# Patient Record
Sex: Female | Born: 1960
Health system: Southern US, Community
[De-identification: ages and names within clinical notes are randomized; demographics above are authoritative.]

## PROBLEM LIST (undated history)

## (undated) DIAGNOSIS — F419 Anxiety disorder, unspecified: Secondary | ICD-10-CM

## (undated) DIAGNOSIS — R160 Hepatomegaly, not elsewhere classified: Secondary | ICD-10-CM

## (undated) DIAGNOSIS — F319 Bipolar disorder, unspecified: Secondary | ICD-10-CM

## (undated) HISTORY — DX: Bipolar disorder, unspecified: F31.9

## (undated) HISTORY — PX: TONSILLECTOMY AND ADENOIDECTOMY: SUR1326

## (undated) HISTORY — DX: Hepatomegaly, not elsewhere classified: R16.0

## (undated) HISTORY — DX: Anxiety disorder, unspecified: F41.9

## (undated) HISTORY — PX: BREAST BIOPSY: SHX20

---

## 2014-02-25 DIAGNOSIS — F259 Schizoaffective disorder, unspecified: Secondary | ICD-10-CM | POA: Diagnosis not present

## 2014-02-25 DIAGNOSIS — F319 Bipolar disorder, unspecified: Secondary | ICD-10-CM | POA: Diagnosis not present

## 2014-04-15 DIAGNOSIS — F319 Bipolar disorder, unspecified: Secondary | ICD-10-CM | POA: Diagnosis not present

## 2014-04-15 DIAGNOSIS — F259 Schizoaffective disorder, unspecified: Secondary | ICD-10-CM | POA: Diagnosis not present

## 2014-05-06 DIAGNOSIS — F319 Bipolar disorder, unspecified: Secondary | ICD-10-CM | POA: Diagnosis not present

## 2014-05-06 DIAGNOSIS — F259 Schizoaffective disorder, unspecified: Secondary | ICD-10-CM | POA: Diagnosis not present

## 2014-05-27 DIAGNOSIS — F250 Schizoaffective disorder, bipolar type: Secondary | ICD-10-CM | POA: Diagnosis not present

## 2014-06-04 DIAGNOSIS — H524 Presbyopia: Secondary | ICD-10-CM | POA: Diagnosis not present

## 2014-06-04 DIAGNOSIS — H52203 Unspecified astigmatism, bilateral: Secondary | ICD-10-CM | POA: Diagnosis not present

## 2014-06-04 DIAGNOSIS — H5213 Myopia, bilateral: Secondary | ICD-10-CM | POA: Diagnosis not present

## 2014-06-12 DIAGNOSIS — F250 Schizoaffective disorder, bipolar type: Secondary | ICD-10-CM | POA: Diagnosis not present

## 2014-06-25 DIAGNOSIS — F250 Schizoaffective disorder, bipolar type: Secondary | ICD-10-CM | POA: Diagnosis not present

## 2014-07-09 DIAGNOSIS — F250 Schizoaffective disorder, bipolar type: Secondary | ICD-10-CM | POA: Diagnosis not present

## 2014-07-23 DIAGNOSIS — F250 Schizoaffective disorder, bipolar type: Secondary | ICD-10-CM | POA: Diagnosis not present

## 2014-07-25 DIAGNOSIS — F250 Schizoaffective disorder, bipolar type: Secondary | ICD-10-CM | POA: Diagnosis not present

## 2014-08-06 DIAGNOSIS — F250 Schizoaffective disorder, bipolar type: Secondary | ICD-10-CM | POA: Diagnosis not present

## 2014-08-20 DIAGNOSIS — F250 Schizoaffective disorder, bipolar type: Secondary | ICD-10-CM | POA: Diagnosis not present

## 2014-09-03 DIAGNOSIS — F250 Schizoaffective disorder, bipolar type: Secondary | ICD-10-CM | POA: Diagnosis not present

## 2014-09-17 DIAGNOSIS — F250 Schizoaffective disorder, bipolar type: Secondary | ICD-10-CM | POA: Diagnosis not present

## 2014-10-01 DIAGNOSIS — F250 Schizoaffective disorder, bipolar type: Secondary | ICD-10-CM | POA: Diagnosis not present

## 2014-10-17 DIAGNOSIS — F250 Schizoaffective disorder, bipolar type: Secondary | ICD-10-CM | POA: Diagnosis not present

## 2014-10-29 DIAGNOSIS — F250 Schizoaffective disorder, bipolar type: Secondary | ICD-10-CM | POA: Diagnosis not present

## 2014-11-12 DIAGNOSIS — F250 Schizoaffective disorder, bipolar type: Secondary | ICD-10-CM | POA: Diagnosis not present

## 2014-11-26 DIAGNOSIS — F250 Schizoaffective disorder, bipolar type: Secondary | ICD-10-CM | POA: Diagnosis not present

## 2014-12-03 DIAGNOSIS — Z0000 Encounter for general adult medical examination without abnormal findings: Secondary | ICD-10-CM | POA: Diagnosis not present

## 2014-12-03 DIAGNOSIS — R739 Hyperglycemia, unspecified: Secondary | ICD-10-CM | POA: Diagnosis not present

## 2014-12-03 DIAGNOSIS — E785 Hyperlipidemia, unspecified: Secondary | ICD-10-CM | POA: Diagnosis not present

## 2014-12-03 DIAGNOSIS — Z1211 Encounter for screening for malignant neoplasm of colon: Secondary | ICD-10-CM | POA: Diagnosis not present

## 2014-12-24 DIAGNOSIS — F250 Schizoaffective disorder, bipolar type: Secondary | ICD-10-CM | POA: Diagnosis not present

## 2014-12-24 DIAGNOSIS — Z1211 Encounter for screening for malignant neoplasm of colon: Secondary | ICD-10-CM | POA: Diagnosis not present

## 2014-12-29 DIAGNOSIS — R0602 Shortness of breath: Secondary | ICD-10-CM | POA: Diagnosis not present

## 2014-12-29 DIAGNOSIS — J209 Acute bronchitis, unspecified: Secondary | ICD-10-CM | POA: Diagnosis not present

## 2015-01-05 DIAGNOSIS — J342 Deviated nasal septum: Secondary | ICD-10-CM | POA: Diagnosis not present

## 2015-01-05 DIAGNOSIS — J3089 Other allergic rhinitis: Secondary | ICD-10-CM | POA: Diagnosis not present

## 2015-01-05 DIAGNOSIS — J209 Acute bronchitis, unspecified: Secondary | ICD-10-CM | POA: Diagnosis not present

## 2015-01-05 DIAGNOSIS — R0982 Postnasal drip: Secondary | ICD-10-CM | POA: Diagnosis not present

## 2015-01-13 DIAGNOSIS — F319 Bipolar disorder, unspecified: Secondary | ICD-10-CM | POA: Diagnosis not present

## 2015-01-13 DIAGNOSIS — F419 Anxiety disorder, unspecified: Secondary | ICD-10-CM | POA: Diagnosis not present

## 2015-01-13 DIAGNOSIS — F250 Schizoaffective disorder, bipolar type: Secondary | ICD-10-CM | POA: Diagnosis not present

## 2015-01-13 DIAGNOSIS — J3089 Other allergic rhinitis: Secondary | ICD-10-CM | POA: Diagnosis not present

## 2015-01-13 DIAGNOSIS — J342 Deviated nasal septum: Secondary | ICD-10-CM | POA: Diagnosis not present

## 2015-01-21 DIAGNOSIS — F250 Schizoaffective disorder, bipolar type: Secondary | ICD-10-CM | POA: Diagnosis not present

## 2015-01-27 DIAGNOSIS — F250 Schizoaffective disorder, bipolar type: Secondary | ICD-10-CM | POA: Diagnosis not present

## 2015-02-10 DIAGNOSIS — F250 Schizoaffective disorder, bipolar type: Secondary | ICD-10-CM | POA: Diagnosis not present

## 2015-02-24 DIAGNOSIS — F250 Schizoaffective disorder, bipolar type: Secondary | ICD-10-CM | POA: Diagnosis not present

## 2015-03-10 DIAGNOSIS — F250 Schizoaffective disorder, bipolar type: Secondary | ICD-10-CM | POA: Diagnosis not present

## 2015-03-24 DIAGNOSIS — F250 Schizoaffective disorder, bipolar type: Secondary | ICD-10-CM | POA: Diagnosis not present

## 2015-04-20 DIAGNOSIS — F411 Generalized anxiety disorder: Secondary | ICD-10-CM | POA: Diagnosis not present

## 2015-04-20 DIAGNOSIS — M791 Myalgia: Secondary | ICD-10-CM | POA: Diagnosis not present

## 2015-04-22 DIAGNOSIS — J069 Acute upper respiratory infection, unspecified: Secondary | ICD-10-CM | POA: Diagnosis not present

## 2015-04-27 DIAGNOSIS — R0602 Shortness of breath: Secondary | ICD-10-CM | POA: Diagnosis not present

## 2015-04-27 DIAGNOSIS — J018 Other acute sinusitis: Secondary | ICD-10-CM | POA: Diagnosis not present

## 2015-05-19 DIAGNOSIS — F250 Schizoaffective disorder, bipolar type: Secondary | ICD-10-CM | POA: Diagnosis not present

## 2015-06-02 DIAGNOSIS — F250 Schizoaffective disorder, bipolar type: Secondary | ICD-10-CM | POA: Diagnosis not present

## 2015-06-23 DIAGNOSIS — F250 Schizoaffective disorder, bipolar type: Secondary | ICD-10-CM | POA: Diagnosis not present

## 2015-07-07 DIAGNOSIS — F250 Schizoaffective disorder, bipolar type: Secondary | ICD-10-CM | POA: Diagnosis not present

## 2015-07-28 DIAGNOSIS — F250 Schizoaffective disorder, bipolar type: Secondary | ICD-10-CM | POA: Diagnosis not present

## 2015-09-08 DIAGNOSIS — F250 Schizoaffective disorder, bipolar type: Secondary | ICD-10-CM | POA: Diagnosis not present

## 2015-09-22 DIAGNOSIS — F250 Schizoaffective disorder, bipolar type: Secondary | ICD-10-CM | POA: Diagnosis not present

## 2015-09-30 ENCOUNTER — Other Ambulatory Visit: Payer: Self-pay | Admitting: Family Medicine

## 2015-09-30 DIAGNOSIS — Z1231 Encounter for screening mammogram for malignant neoplasm of breast: Secondary | ICD-10-CM

## 2015-10-10 DIAGNOSIS — F250 Schizoaffective disorder, bipolar type: Secondary | ICD-10-CM | POA: Diagnosis not present

## 2015-10-20 DIAGNOSIS — F250 Schizoaffective disorder, bipolar type: Secondary | ICD-10-CM | POA: Diagnosis not present

## 2015-10-23 DIAGNOSIS — F250 Schizoaffective disorder, bipolar type: Secondary | ICD-10-CM | POA: Diagnosis not present

## 2015-11-03 DIAGNOSIS — F250 Schizoaffective disorder, bipolar type: Secondary | ICD-10-CM | POA: Diagnosis not present

## 2015-11-10 ENCOUNTER — Ambulatory Visit: Payer: Self-pay

## 2015-11-10 ENCOUNTER — Ambulatory Visit
Admission: RE | Admit: 2015-11-10 | Discharge: 2015-11-10 | Disposition: A | Discharge status: Home / Self Care | Payer: Medicare Other | Source: Ambulatory Visit | Attending: Family Medicine | Admitting: Family Medicine

## 2015-11-10 DIAGNOSIS — Z1231 Encounter for screening mammogram for malignant neoplasm of breast: Secondary | ICD-10-CM

## 2015-11-17 DIAGNOSIS — F250 Schizoaffective disorder, bipolar type: Secondary | ICD-10-CM | POA: Diagnosis not present

## 2015-12-01 DIAGNOSIS — F250 Schizoaffective disorder, bipolar type: Secondary | ICD-10-CM | POA: Diagnosis not present

## 2015-12-08 DIAGNOSIS — E785 Hyperlipidemia, unspecified: Secondary | ICD-10-CM | POA: Diagnosis not present

## 2015-12-08 DIAGNOSIS — Z1211 Encounter for screening for malignant neoplasm of colon: Secondary | ICD-10-CM | POA: Diagnosis not present

## 2015-12-08 DIAGNOSIS — Z0000 Encounter for general adult medical examination without abnormal findings: Secondary | ICD-10-CM | POA: Diagnosis not present

## 2015-12-15 DIAGNOSIS — F250 Schizoaffective disorder, bipolar type: Secondary | ICD-10-CM | POA: Diagnosis not present

## 2015-12-29 DIAGNOSIS — F250 Schizoaffective disorder, bipolar type: Secondary | ICD-10-CM | POA: Diagnosis not present

## 2016-01-21 DIAGNOSIS — F250 Schizoaffective disorder, bipolar type: Secondary | ICD-10-CM | POA: Diagnosis not present

## 2016-02-04 DIAGNOSIS — F250 Schizoaffective disorder, bipolar type: Secondary | ICD-10-CM | POA: Diagnosis not present

## 2016-03-03 DIAGNOSIS — F250 Schizoaffective disorder, bipolar type: Secondary | ICD-10-CM | POA: Diagnosis not present

## 2016-03-17 DIAGNOSIS — F429 Obsessive-compulsive disorder, unspecified: Secondary | ICD-10-CM | POA: Diagnosis not present

## 2016-03-17 DIAGNOSIS — F250 Schizoaffective disorder, bipolar type: Secondary | ICD-10-CM | POA: Diagnosis not present

## 2016-04-01 DIAGNOSIS — F250 Schizoaffective disorder, bipolar type: Secondary | ICD-10-CM | POA: Diagnosis not present

## 2016-04-01 DIAGNOSIS — F429 Obsessive-compulsive disorder, unspecified: Secondary | ICD-10-CM | POA: Diagnosis not present

## 2016-04-07 DIAGNOSIS — Z1211 Encounter for screening for malignant neoplasm of colon: Secondary | ICD-10-CM | POA: Diagnosis not present

## 2016-04-16 DIAGNOSIS — F429 Obsessive-compulsive disorder, unspecified: Secondary | ICD-10-CM | POA: Diagnosis not present

## 2016-04-16 DIAGNOSIS — F250 Schizoaffective disorder, bipolar type: Secondary | ICD-10-CM | POA: Diagnosis not present

## 2016-04-26 DIAGNOSIS — F250 Schizoaffective disorder, bipolar type: Secondary | ICD-10-CM | POA: Diagnosis not present

## 2016-04-26 DIAGNOSIS — F429 Obsessive-compulsive disorder, unspecified: Secondary | ICD-10-CM | POA: Diagnosis not present

## 2016-05-10 DIAGNOSIS — F250 Schizoaffective disorder, bipolar type: Secondary | ICD-10-CM | POA: Diagnosis not present

## 2016-05-20 DIAGNOSIS — F429 Obsessive-compulsive disorder, unspecified: Secondary | ICD-10-CM | POA: Diagnosis not present

## 2016-05-20 DIAGNOSIS — F250 Schizoaffective disorder, bipolar type: Secondary | ICD-10-CM | POA: Diagnosis not present

## 2016-05-28 DIAGNOSIS — F250 Schizoaffective disorder, bipolar type: Secondary | ICD-10-CM | POA: Diagnosis not present

## 2016-05-28 DIAGNOSIS — F429 Obsessive-compulsive disorder, unspecified: Secondary | ICD-10-CM | POA: Diagnosis not present

## 2016-06-07 DIAGNOSIS — F250 Schizoaffective disorder, bipolar type: Secondary | ICD-10-CM | POA: Diagnosis not present

## 2016-06-07 DIAGNOSIS — F429 Obsessive-compulsive disorder, unspecified: Secondary | ICD-10-CM | POA: Diagnosis not present

## 2016-06-21 DIAGNOSIS — F250 Schizoaffective disorder, bipolar type: Secondary | ICD-10-CM | POA: Diagnosis not present

## 2016-06-21 DIAGNOSIS — F429 Obsessive-compulsive disorder, unspecified: Secondary | ICD-10-CM | POA: Diagnosis not present

## 2016-06-30 ENCOUNTER — Encounter (HOSPITAL_COMMUNITY): Payer: Self-pay | Admitting: *Deleted

## 2016-06-30 DIAGNOSIS — L509 Urticaria, unspecified: Secondary | ICD-10-CM | POA: Insufficient documentation

## 2016-06-30 DIAGNOSIS — T7840XA Allergy, unspecified, initial encounter: Secondary | ICD-10-CM | POA: Insufficient documentation

## 2016-06-30 NOTE — ED Triage Notes (Signed)
The pt started itching on her upper torso 1800  Some hives in her a-c jopint behind her knees and she has more along her bra line and around her waist.  Itching.  She came in by [facility]  They gave her benadry 25mg   She has no difficulty breathing at present

## 2016-07-01 ENCOUNTER — Emergency Department (HOSPITAL_COMMUNITY)
Admission: EM | Admit: 2016-07-01 | Discharge: 2016-07-01 | Disposition: A | Discharge status: Home / Self Care | Payer: Medicare Other | Attending: Emergency Medicine | Admitting: Emergency Medicine

## 2016-07-01 DIAGNOSIS — T7840XA Allergy, unspecified, initial encounter: Secondary | ICD-10-CM

## 2016-07-01 DIAGNOSIS — L509 Urticaria, unspecified: Secondary | ICD-10-CM

## 2016-07-01 MED ORDER — DIPHENHYDRAMINE HCL 25 MG PO CAPS
25.0000 mg | ORAL_CAPSULE | Freq: Once | ORAL | Status: AC
  Administered 2016-07-01: 25 mg via ORAL
  Filled 2016-07-01: qty 1

## 2016-07-01 MED ORDER — RANITIDINE HCL 150 MG/10ML PO SYRP
150.0000 mg | ORAL_SOLUTION | Freq: Once | ORAL | Status: AC
  Administered 2016-07-01: 150 mg via ORAL
  Filled 2016-07-01: qty 10

## 2016-07-01 MED ORDER — DEXAMETHASONE SODIUM PHOSPHATE 10 MG/ML IJ SOLN
10.0000 mg | Freq: Once | INTRAMUSCULAR | Status: AC
  Administered 2016-07-01: 10 mg via INTRAVENOUS
  Filled 2016-07-01: qty 1

## 2016-07-01 NOTE — ED Provider Notes (Signed)
[facility] DEPT Provider Note   CSN: [id number] Arrival date & time: 06/30/16  2339  By signing my name below, I, Oleh Genin, attest that this documentation has been prepared under the direction and in the presence of Varney Biles, MD. Electronically Signed: Oleh Genin, Scribe. 07/01/16. 12:45 AM.   History   Chief Complaint Chief Complaint  Patient presents with  . Allergic Reaction    HPI Misty Campos is a 56 y.o. female with history of bipolarism who presents to the ED with a suspected allergic reaction. This patient states that approximately 7 hours ago she first noticed a rash in her bilateral axilla with subsequent presentation over the upper abdomen and medial thighs. She is having associated itching. She called EMS who gave 25mg  Benadryl; she states that the medication helped her itching. She denies any known allergies. She has no new exposures today of which she is aware. Denies any vomiting, diarrhea, or dyspnea.  The history is provided by the patient. No language interpreter was used.  Allergic Reaction  Presenting symptoms: no difficulty breathing and no swelling   Severity:  Moderate Prior allergic episodes:  No prior episodes Context comment:  Unknown Relieved by: benadryl.   History reviewed. No pertinent past medical history.  There are no active problems to display for this patient.   History reviewed. No pertinent surgical history.  OB History    No data available       Home Medications    Prior to Admission medications   Not on File    Family History No family history on file.  Social History Social History  Substance Use Topics  . Smoking status: Never Smoker  . Smokeless tobacco: Never Used  . Alcohol use No     Allergies   Patient has no known allergies.   Review of Systems Review of Systems All systems reviewed and are negative for acute change except as noted in the HPI.  Physical Exam Updated Vital  Signs BP 119/66   Pulse 80   Temp 97.7 F (36.5 C) (Oral)   Resp 18   SpO2 100%   Physical Exam  Constitutional: She is oriented to person, place, and time. She appears well-developed and well-nourished.  HENT:  Head: Normocephalic and atraumatic.  No edema over the oral mucosa.   Cardiovascular: Normal rate.   Pulmonary/Chest: Effort normal and breath sounds normal. No stridor. No respiratory distress. She has no wheezes. She has no rales.  Neurological: She is alert and oriented to person, place, and time.  Skin: Skin is warm and dry.  There is an urticarial type lesion over the distal wrists, over the bilateral upper quadrants, and over the inguinal area. The erythematous regions are blanching.  Psychiatric: She has a normal mood and affect.  Nursing note and vitals reviewed.    ED Treatments / Results  Labs (all labs ordered are listed, but only abnormal results are displayed) Labs Reviewed - No data to display  EKG  EKG Interpretation None       Radiology No results found.  Procedures Procedures (including critical care time)  Medications Ordered in ED Medications  diphenhydrAMINE (BENADRYL) capsule 25 mg (25 mg Oral Given 07/01/16 0101)  ranitidine (ZANTAC) 150 MG/10ML syrup 150 mg (150 mg Oral Given 07/01/16 0101)  dexamethasone (DECADRON) injection 10 mg (10 mg Intravenous Given 07/01/16 0101)     Initial Impression / Assessment and Plan / ED Course  I have reviewed the triage vital signs and the nursing  notes.  Pertinent labs & imaging results that were available during my care of the patient were reviewed by me and considered in my medical decision making (see chart for details).    Pt comes in with urticarial rash, unknown source. Pt has no respiratory complain or stridor, drooling, wheezing. WE will give her the allergy meds and reassess.  REASSESSMENT: Rash improved over the wrist and abd. Strict ER return precautions have been discussed, and  patient is agreeing with the plan and is comfortable with the workup done and the recommendations from the ER.     Final Clinical Impressions(s) / ED Diagnoses   Final diagnoses:  Urticaria  Allergic reaction, initial encounter    New Prescriptions There are no discharge medications for this patient.    Varney Biles, MD 07/05/16 [phone]

## 2016-07-01 NOTE — ED Notes (Signed)
D/c instructions given. Pt verbalizes understanding of instructions. Pt ambulatory and discharged by self.

## 2016-07-02 DIAGNOSIS — F3181 Bipolar II disorder: Secondary | ICD-10-CM | POA: Diagnosis not present

## 2016-07-02 DIAGNOSIS — K5900 Constipation, unspecified: Secondary | ICD-10-CM | POA: Diagnosis not present

## 2016-07-02 DIAGNOSIS — L309 Dermatitis, unspecified: Secondary | ICD-10-CM | POA: Diagnosis not present

## 2016-07-05 DIAGNOSIS — F429 Obsessive-compulsive disorder, unspecified: Secondary | ICD-10-CM | POA: Diagnosis not present

## 2016-07-05 DIAGNOSIS — F250 Schizoaffective disorder, bipolar type: Secondary | ICD-10-CM | POA: Diagnosis not present

## 2016-07-20 DIAGNOSIS — F429 Obsessive-compulsive disorder, unspecified: Secondary | ICD-10-CM | POA: Diagnosis not present

## 2016-07-20 DIAGNOSIS — F250 Schizoaffective disorder, bipolar type: Secondary | ICD-10-CM | POA: Diagnosis not present

## 2016-08-19 DIAGNOSIS — F250 Schizoaffective disorder, bipolar type: Secondary | ICD-10-CM | POA: Diagnosis not present

## 2016-08-19 DIAGNOSIS — F429 Obsessive-compulsive disorder, unspecified: Secondary | ICD-10-CM | POA: Diagnosis not present

## 2016-08-31 DIAGNOSIS — L814 Other melanin hyperpigmentation: Secondary | ICD-10-CM | POA: Diagnosis not present

## 2016-08-31 DIAGNOSIS — D225 Melanocytic nevi of trunk: Secondary | ICD-10-CM | POA: Diagnosis not present

## 2016-08-31 DIAGNOSIS — L821 Other seborrheic keratosis: Secondary | ICD-10-CM | POA: Diagnosis not present

## 2016-09-02 DIAGNOSIS — F429 Obsessive-compulsive disorder, unspecified: Secondary | ICD-10-CM | POA: Diagnosis not present

## 2016-09-02 DIAGNOSIS — F250 Schizoaffective disorder, bipolar type: Secondary | ICD-10-CM | POA: Diagnosis not present

## 2016-09-15 DIAGNOSIS — F429 Obsessive-compulsive disorder, unspecified: Secondary | ICD-10-CM | POA: Diagnosis not present

## 2016-09-15 DIAGNOSIS — F250 Schizoaffective disorder, bipolar type: Secondary | ICD-10-CM | POA: Diagnosis not present

## 2016-09-29 DIAGNOSIS — Z124 Encounter for screening for malignant neoplasm of cervix: Secondary | ICD-10-CM | POA: Diagnosis not present

## 2016-09-29 DIAGNOSIS — Z01419 Encounter for gynecological examination (general) (routine) without abnormal findings: Secondary | ICD-10-CM | POA: Diagnosis not present

## 2016-10-06 DIAGNOSIS — F429 Obsessive-compulsive disorder, unspecified: Secondary | ICD-10-CM | POA: Diagnosis not present

## 2016-10-06 DIAGNOSIS — F250 Schizoaffective disorder, bipolar type: Secondary | ICD-10-CM | POA: Diagnosis not present

## 2016-10-08 DIAGNOSIS — R140 Abdominal distension (gaseous): Secondary | ICD-10-CM | POA: Diagnosis not present

## 2016-10-08 DIAGNOSIS — N950 Postmenopausal bleeding: Secondary | ICD-10-CM | POA: Diagnosis not present

## 2016-10-21 DIAGNOSIS — F250 Schizoaffective disorder, bipolar type: Secondary | ICD-10-CM | POA: Diagnosis not present

## 2016-10-21 DIAGNOSIS — F429 Obsessive-compulsive disorder, unspecified: Secondary | ICD-10-CM | POA: Diagnosis not present

## 2016-11-10 DIAGNOSIS — K5900 Constipation, unspecified: Secondary | ICD-10-CM | POA: Diagnosis not present

## 2016-11-15 DIAGNOSIS — Z1231 Encounter for screening mammogram for malignant neoplasm of breast: Secondary | ICD-10-CM | POA: Diagnosis not present

## 2016-11-16 DIAGNOSIS — F250 Schizoaffective disorder, bipolar type: Secondary | ICD-10-CM | POA: Diagnosis not present

## 2016-11-22 DIAGNOSIS — F250 Schizoaffective disorder, bipolar type: Secondary | ICD-10-CM | POA: Diagnosis not present

## 2016-11-22 DIAGNOSIS — F429 Obsessive-compulsive disorder, unspecified: Secondary | ICD-10-CM | POA: Diagnosis not present

## 2016-11-25 DIAGNOSIS — H43811 Vitreous degeneration, right eye: Secondary | ICD-10-CM | POA: Diagnosis not present

## 2016-12-08 DIAGNOSIS — F429 Obsessive-compulsive disorder, unspecified: Secondary | ICD-10-CM | POA: Diagnosis not present

## 2016-12-08 DIAGNOSIS — F250 Schizoaffective disorder, bipolar type: Secondary | ICD-10-CM | POA: Diagnosis not present

## 2016-12-14 DIAGNOSIS — F3181 Bipolar II disorder: Secondary | ICD-10-CM | POA: Diagnosis not present

## 2016-12-14 DIAGNOSIS — E785 Hyperlipidemia, unspecified: Secondary | ICD-10-CM | POA: Diagnosis not present

## 2016-12-14 DIAGNOSIS — Z0000 Encounter for general adult medical examination without abnormal findings: Secondary | ICD-10-CM | POA: Diagnosis not present

## 2016-12-14 DIAGNOSIS — Z5181 Encounter for therapeutic drug level monitoring: Secondary | ICD-10-CM | POA: Diagnosis not present

## 2016-12-14 DIAGNOSIS — K5900 Constipation, unspecified: Secondary | ICD-10-CM | POA: Diagnosis not present

## 2016-12-23 DIAGNOSIS — F429 Obsessive-compulsive disorder, unspecified: Secondary | ICD-10-CM | POA: Diagnosis not present

## 2016-12-23 DIAGNOSIS — F250 Schizoaffective disorder, bipolar type: Secondary | ICD-10-CM | POA: Diagnosis not present

## 2017-01-17 DIAGNOSIS — F429 Obsessive-compulsive disorder, unspecified: Secondary | ICD-10-CM | POA: Diagnosis not present

## 2017-01-17 DIAGNOSIS — F250 Schizoaffective disorder, bipolar type: Secondary | ICD-10-CM | POA: Diagnosis not present

## 2017-02-07 DIAGNOSIS — F429 Obsessive-compulsive disorder, unspecified: Secondary | ICD-10-CM | POA: Diagnosis not present

## 2017-03-01 DIAGNOSIS — F250 Schizoaffective disorder, bipolar type: Secondary | ICD-10-CM | POA: Diagnosis not present

## 2017-03-01 DIAGNOSIS — F429 Obsessive-compulsive disorder, unspecified: Secondary | ICD-10-CM | POA: Diagnosis not present

## 2017-03-03 DIAGNOSIS — R21 Rash and other nonspecific skin eruption: Secondary | ICD-10-CM | POA: Diagnosis not present

## 2017-03-03 DIAGNOSIS — B001 Herpesviral vesicular dermatitis: Secondary | ICD-10-CM | POA: Diagnosis not present

## 2017-03-03 DIAGNOSIS — R51 Headache: Secondary | ICD-10-CM | POA: Diagnosis not present

## 2017-03-03 DIAGNOSIS — B0059 Other herpesviral disease of eye: Secondary | ICD-10-CM | POA: Diagnosis not present

## 2017-03-03 DIAGNOSIS — H5789 Other specified disorders of eye and adnexa: Secondary | ICD-10-CM | POA: Diagnosis not present

## 2017-03-04 DIAGNOSIS — B0239 Other herpes zoster eye disease: Secondary | ICD-10-CM | POA: Diagnosis not present

## 2017-03-08 DIAGNOSIS — B0239 Other herpes zoster eye disease: Secondary | ICD-10-CM | POA: Diagnosis not present

## 2017-03-11 DIAGNOSIS — B029 Zoster without complications: Secondary | ICD-10-CM | POA: Diagnosis not present

## 2017-03-14 DIAGNOSIS — B0239 Other herpes zoster eye disease: Secondary | ICD-10-CM | POA: Diagnosis not present

## 2017-03-22 DIAGNOSIS — F250 Schizoaffective disorder, bipolar type: Secondary | ICD-10-CM | POA: Diagnosis not present

## 2017-03-22 DIAGNOSIS — F429 Obsessive-compulsive disorder, unspecified: Secondary | ICD-10-CM | POA: Diagnosis not present

## 2017-03-25 DIAGNOSIS — B0239 Other herpes zoster eye disease: Secondary | ICD-10-CM | POA: Diagnosis not present

## 2017-04-08 DIAGNOSIS — K5900 Constipation, unspecified: Secondary | ICD-10-CM | POA: Diagnosis not present

## 2017-04-15 ENCOUNTER — Other Ambulatory Visit: Payer: Self-pay | Admitting: Family Medicine

## 2017-04-15 DIAGNOSIS — R1084 Generalized abdominal pain: Secondary | ICD-10-CM

## 2017-04-18 DIAGNOSIS — F250 Schizoaffective disorder, bipolar type: Secondary | ICD-10-CM | POA: Diagnosis not present

## 2017-04-18 DIAGNOSIS — F429 Obsessive-compulsive disorder, unspecified: Secondary | ICD-10-CM | POA: Diagnosis not present

## 2017-04-22 DIAGNOSIS — R160 Hepatomegaly, not elsewhere classified: Secondary | ICD-10-CM

## 2017-04-22 HISTORY — DX: Hepatomegaly, not elsewhere classified: R16.0

## 2017-04-26 DIAGNOSIS — K5901 Slow transit constipation: Secondary | ICD-10-CM | POA: Diagnosis not present

## 2017-05-02 ENCOUNTER — Ambulatory Visit
Admission: RE | Admit: 2017-05-02 | Discharge: 2017-05-02 | Disposition: A | Discharge status: Home / Self Care | Payer: Medicare Other | Source: Ambulatory Visit | Attending: Family Medicine | Admitting: Family Medicine

## 2017-05-02 DIAGNOSIS — K5900 Constipation, unspecified: Secondary | ICD-10-CM | POA: Diagnosis not present

## 2017-05-02 DIAGNOSIS — R1084 Generalized abdominal pain: Secondary | ICD-10-CM

## 2017-05-02 MED ORDER — IOPAMIDOL (ISOVUE-300) INJECTION 61%
100.0000 mL | Freq: Once | INTRAVENOUS | Status: AC | PRN
  Administered 2017-05-02: 100 mL via INTRAVENOUS

## 2017-05-09 DIAGNOSIS — R140 Abdominal distension (gaseous): Secondary | ICD-10-CM | POA: Diagnosis not present

## 2017-05-10 DIAGNOSIS — F429 Obsessive-compulsive disorder, unspecified: Secondary | ICD-10-CM | POA: Diagnosis not present

## 2017-05-10 DIAGNOSIS — F250 Schizoaffective disorder, bipolar type: Secondary | ICD-10-CM | POA: Diagnosis not present

## 2017-05-11 DIAGNOSIS — F429 Obsessive-compulsive disorder, unspecified: Secondary | ICD-10-CM | POA: Diagnosis not present

## 2017-05-11 DIAGNOSIS — F250 Schizoaffective disorder, bipolar type: Secondary | ICD-10-CM | POA: Diagnosis not present

## 2017-05-24 DIAGNOSIS — B0059 Other herpesviral disease of eye: Secondary | ICD-10-CM | POA: Diagnosis not present

## 2017-06-02 DIAGNOSIS — F250 Schizoaffective disorder, bipolar type: Secondary | ICD-10-CM | POA: Diagnosis not present

## 2017-06-28 DIAGNOSIS — Z1211 Encounter for screening for malignant neoplasm of colon: Secondary | ICD-10-CM | POA: Diagnosis not present

## 2017-06-28 DIAGNOSIS — B0229 Other postherpetic nervous system involvement: Secondary | ICD-10-CM | POA: Diagnosis not present

## 2017-06-29 DIAGNOSIS — F250 Schizoaffective disorder, bipolar type: Secondary | ICD-10-CM | POA: Diagnosis not present

## 2017-06-29 DIAGNOSIS — F429 Obsessive-compulsive disorder, unspecified: Secondary | ICD-10-CM | POA: Diagnosis not present

## 2017-06-30 DIAGNOSIS — Z1211 Encounter for screening for malignant neoplasm of colon: Secondary | ICD-10-CM | POA: Diagnosis not present

## 2017-07-19 DIAGNOSIS — B0059 Other herpesviral disease of eye: Secondary | ICD-10-CM | POA: Diagnosis not present

## 2017-07-20 DIAGNOSIS — F429 Obsessive-compulsive disorder, unspecified: Secondary | ICD-10-CM | POA: Diagnosis not present

## 2017-07-20 DIAGNOSIS — F250 Schizoaffective disorder, bipolar type: Secondary | ICD-10-CM | POA: Diagnosis not present

## 2017-07-26 DIAGNOSIS — Z0000 Encounter for general adult medical examination without abnormal findings: Secondary | ICD-10-CM | POA: Diagnosis not present

## 2017-07-26 DIAGNOSIS — E785 Hyperlipidemia, unspecified: Secondary | ICD-10-CM | POA: Diagnosis not present

## 2017-07-26 DIAGNOSIS — Z5181 Encounter for therapeutic drug level monitoring: Secondary | ICD-10-CM | POA: Diagnosis not present

## 2017-07-26 DIAGNOSIS — Z136 Encounter for screening for cardiovascular disorders: Secondary | ICD-10-CM | POA: Diagnosis not present

## 2017-08-01 DIAGNOSIS — R2242 Localized swelling, mass and lump, left lower limb: Secondary | ICD-10-CM | POA: Diagnosis not present

## 2017-08-02 ENCOUNTER — Encounter: Payer: Self-pay | Admitting: Internal Medicine

## 2017-08-12 DIAGNOSIS — F429 Obsessive-compulsive disorder, unspecified: Secondary | ICD-10-CM | POA: Diagnosis not present

## 2017-08-12 DIAGNOSIS — F250 Schizoaffective disorder, bipolar type: Secondary | ICD-10-CM | POA: Diagnosis not present

## 2017-08-31 DIAGNOSIS — L918 Other hypertrophic disorders of the skin: Secondary | ICD-10-CM | POA: Diagnosis not present

## 2017-08-31 DIAGNOSIS — L814 Other melanin hyperpigmentation: Secondary | ICD-10-CM | POA: Diagnosis not present

## 2017-08-31 DIAGNOSIS — D1801 Hemangioma of skin and subcutaneous tissue: Secondary | ICD-10-CM | POA: Diagnosis not present

## 2017-08-31 DIAGNOSIS — L821 Other seborrheic keratosis: Secondary | ICD-10-CM | POA: Diagnosis not present

## 2017-08-31 DIAGNOSIS — L905 Scar conditions and fibrosis of skin: Secondary | ICD-10-CM | POA: Diagnosis not present

## 2017-08-31 DIAGNOSIS — L718 Other rosacea: Secondary | ICD-10-CM | POA: Diagnosis not present

## 2017-09-02 DIAGNOSIS — F250 Schizoaffective disorder, bipolar type: Secondary | ICD-10-CM | POA: Diagnosis not present

## 2017-09-02 DIAGNOSIS — F429 Obsessive-compulsive disorder, unspecified: Secondary | ICD-10-CM | POA: Diagnosis not present

## 2017-09-09 DIAGNOSIS — H5712 Ocular pain, left eye: Secondary | ICD-10-CM | POA: Diagnosis not present

## 2017-09-22 ENCOUNTER — Encounter: Payer: Self-pay | Admitting: *Deleted

## 2017-09-22 DIAGNOSIS — F429 Obsessive-compulsive disorder, unspecified: Secondary | ICD-10-CM | POA: Diagnosis not present

## 2017-09-22 DIAGNOSIS — F250 Schizoaffective disorder, bipolar type: Secondary | ICD-10-CM | POA: Diagnosis not present

## 2017-10-13 DIAGNOSIS — F250 Schizoaffective disorder, bipolar type: Secondary | ICD-10-CM | POA: Diagnosis not present

## 2017-10-13 DIAGNOSIS — F429 Obsessive-compulsive disorder, unspecified: Secondary | ICD-10-CM | POA: Diagnosis not present

## 2017-10-18 ENCOUNTER — Ambulatory Visit (INDEPENDENT_AMBULATORY_CARE_PROVIDER_SITE_OTHER): Payer: Medicare Other | Admitting: Internal Medicine

## 2017-10-18 ENCOUNTER — Encounter: Payer: Self-pay | Admitting: Internal Medicine

## 2017-10-18 VITALS — BP 128/76 | HR 68 | Ht 68.0 in | Wt 179.6 lb

## 2017-10-18 DIAGNOSIS — Z1211 Encounter for screening for malignant neoplasm of colon: Secondary | ICD-10-CM | POA: Diagnosis not present

## 2017-10-18 DIAGNOSIS — K5900 Constipation, unspecified: Secondary | ICD-10-CM | POA: Diagnosis not present

## 2017-10-18 DIAGNOSIS — R140 Abdominal distension (gaseous): Secondary | ICD-10-CM

## 2017-10-18 MED ORDER — LINACLOTIDE 72 MCG PO CAPS: 72.0000 ug | ORAL_CAPSULE | Freq: Every day | ORAL | 3 refills | Status: DC

## 2017-10-18 NOTE — Progress Notes (Signed)
Patient ID: Misty Campos, female   DOB: [birth year], 57 y.o.   MRN: [id number] HPI: Misty Campos is a 57 year old female with PMH of biopolar disorder II seen in consultation at the request of Dr. Justin Mend [facility]) to discuss lower abdominal bloating, constipation type symptom as well as consideration of colorectal cancer screening.  She is here alone today.    She reports that she has had somewhat long-standing issues with lower abdominal bloating.  She associates this with some mild constipation and intermittent incomplete evacuation.  At times the lower abdominal bloating can be uncomfortable and be like a "pressure" sensation in her lower abdomen.  She denies seeing blood in her stool or melena.  There is been no major change in bowel habit.  Denies abdominal pain other than the uncomfortable lower abdomen during times when the bloating is most significant.  She denies upper GI and hepatobiliary complaint.  She has a family history of colon cancer in her maternal grandmother.  No other relatives to her knowledge with history of colon cancer.  She does have a history of bipolar 2 disorder and was on loxapine for many years.  This stopped around March 2018.  She is transitioning psychiatrists to [employer] which will be a new provider for her as her former psychiatrist is retiring.  She also sees a licensed [employer] representative.  She has never had a colonoscopy and admits that she is concerned about the sedation.  She has many questions today regarding colonoscopy, prep, sedation, and other options for colon cancer screening.  She does report having previous negative home stool test which I assume is FOBT.  She did have a CT scan performed in March of this year with IV contrast of the abdomen and pelvis.  This was done for her lower abdominal discomfort.  This was unremarkable.  There was some mild hepatomegaly noted.  I reviewed this study.  Past Medical History:   Diagnosis Date  . Bipolar disorder ([place])   . Liver enlargement 04/2017    Past Surgical History:  Procedure Laterality Date  . TONSILLECTOMY AND ADENOIDECTOMY      Outpatient Medications Prior to Visit  Medication Sig Dispense Refill  . acyclovir (ZOVIRAX) 400 MG tablet Take 400 mg by mouth 2 (two) times daily.  12  . clonazePAM (KLONOPIN) 0.5 MG tablet Take 1 tablet by mouth 3 (three) times daily as needed.  0  . gabapentin (NEURONTIN) 100 MG capsule Take 1 capsule by mouth 2 (two) times daily.  11   No facility-administered medications prior to visit.     No Known Allergies  Family History  Problem Relation Age of Onset  . Colon cancer Maternal Grandmother   . Colon cancer Maternal Grandfather   . Pancreatic cancer Maternal Grandfather     Social History   Tobacco Use  . Smoking status: Never Smoker  . Smokeless tobacco: Never Used  Substance Use Topics  . Alcohol use: No  . Drug use: Not Currently    ROS: As per history of present illness, otherwise negative  BP 128/76   Pulse 68   Ht 5\' 8"  (1.727 m)   Wt 179 lb 9.6 oz (81.5 kg)   BMI 27.31 kg/m  Constitutional: Well-developed and well-nourished. No distress. HEENT: Normocephalic and atraumatic. Oropharynx is clear and moist.  No scleral icterus. Neck: Neck supple. Trachea midline. Cardiovascular: Normal rate, regular rhythm and intact distal pulses. No M/R/G Pulmonary/chest: Effort normal and breath sounds  normal. No wheezing, rales or rhonchi. Abdominal: Soft, nontender, nondistended. Bowel sounds active throughout.  Extremities: no clubbing, cyanosis, or edema Neurological: Alert and oriented to person place and time. Skin: Skin is warm and dry.  Psychiatric: Normal mood and affect. Behavior is normal.  CT ABDOMEN AND PELVIS WITH CONTRAST   TECHNIQUE: Multidetector CT imaging of the abdomen and pelvis was performed using the standard protocol following bolus administration of intravenous  contrast.   CONTRAST:  162mL ISOVUE-300 IOPAMIDOL (ISOVUE-300) INJECTION 61%   COMPARISON:  None.   FINDINGS: Lower chest: Minimal scarring/atelectasis lung bases without worrisome mass noted. Heart size within normal limits.   Hepatobiliary: Slightly enlarged liver spanning over 18.3 cm. No worrisome hepatic lesion. No calcified gallstones.   Pancreas: No pancreatic mass or inflammation.   Spleen: No splenic mass or enlargement.   Adrenals/Urinary Tract: No obstructing stone or hydronephrosis. Slightly lobulated contour of the kidneys without worrisome renal mass. No adrenal lesion. Noncontrast filled views of the urinary bladder without gross abnormality.   Stomach/Bowel: No extraluminal bowel inflammatory process. Taking into account under distension, no obvious bowel mass identified.   Vascular/Lymphatic: No abdominal aortic aneurysm or large vessel occlusion. No adenopathy.   Reproductive: No worrisome adnexal or uterine abnormality.   Other: No free air or bowel containing hernia.   Musculoskeletal: Mild curvature lumbar spine convex left. Scattered mild degenerative changes without worrisome osseous lesion.   IMPRESSION: No cause of patient's constipation or abdominal pain identified on the current exam.   Slightly enlarged liver spanning over 18.3 cm.     Electronically Signed   By: Genia Del M.D.   On: 05/02/2017 12:36    ASSESSMENT/PLAN: 57 year old female with PMH of biopolar disorder II seen in consultation at the request of Dr. Justin Mend [facility]) to discuss lower abdominal bloating, constipation type symptom as well as consideration of colorectal cancer screening.   1.  Lower abdominal bloating with constipation --we discussed her symptoms at length today.  I would like to try her on a low-dose laxative to see if more regular and complete bow I just put in the naris because I do not know how when you were coming back and I could not el  movements improves her abdominal bloating symptom.  We will try Linzess 72 mcg once daily, 30 minutes before breakfast.  I asked that she notify me in about 4 weeks as to her response to this medication.  Certainly other options exist if this medication is not tolerated or is not effective.  2.  CRC screening --I strongly recommended colon cancer screening for her today.  We discussed colonoscopy at length including the risks, benefits and alternatives.  We discussed sedation with propofol which is the primary sedative used in our endoscopy center.  We discussed other options including Cologuard, virtual CT colonoscopy and barium enema.  She requested time to think about which test she would like to pursue and states she will notify me when she reaches a decision.  She understands my recommendation for some type of CRC screening  I would like her to come back in 4 to 6 weeks for a visit with me or an advanced practitioner for follow-up/continuity   Cc:[facility], [address], [place] [address]  Dr. Justin Mend

## 2017-10-18 NOTE — Patient Instructions (Signed)
You have been scheduled to see Misty Ba, PA-C on Monday, 11/21/17 at 3:30 pm for follow up.  Please research and think over whether you prefer to have colonoscopy, cologuard, Barium enema, or virtual colonoscopy for evaluation for screening colonoscopy, constipation, bloating.  We have sent the following medications to your pharmacy for you to pick up at your convenience: Linzess 72 mcg daily.  If you are age 56 or older, your body mass index should be between 23-30. Your Body mass index is 27.31 kg/m. If this is out of the aforementioned range listed, please consider follow up with your Primary Care Provider.  If you are age 36 or younger, your body mass index should be between 19-25. Your Body mass index is 27.31 kg/m. If this is out of the aformentioned range listed, please consider follow up with your Primary Care Provider.

## 2017-10-31 DIAGNOSIS — Z79891 Long term (current) use of opiate analgesic: Secondary | ICD-10-CM | POA: Diagnosis not present

## 2017-10-31 DIAGNOSIS — F250 Schizoaffective disorder, bipolar type: Secondary | ICD-10-CM | POA: Diagnosis not present

## 2017-10-31 DIAGNOSIS — F429 Obsessive-compulsive disorder, unspecified: Secondary | ICD-10-CM | POA: Diagnosis not present

## 2017-10-31 DIAGNOSIS — F419 Anxiety disorder, unspecified: Secondary | ICD-10-CM | POA: Diagnosis not present

## 2017-11-01 ENCOUNTER — Telehealth: Payer: Self-pay | Admitting: Internal Medicine

## 2017-11-01 NOTE — Telephone Encounter (Signed)
Pt called to report she is taking the linzess and does think she is better but she is still having some bloating. Pt instructed to continue taking the linzess and to call back if she does not continue to improve. Pt knows to keep her follow-up appt as scheduled. Dr. Hilarie Fredrickson aware.

## 2017-11-03 DIAGNOSIS — F250 Schizoaffective disorder, bipolar type: Secondary | ICD-10-CM | POA: Diagnosis not present

## 2017-11-03 DIAGNOSIS — F419 Anxiety disorder, unspecified: Secondary | ICD-10-CM | POA: Diagnosis not present

## 2017-11-03 DIAGNOSIS — F429 Obsessive-compulsive disorder, unspecified: Secondary | ICD-10-CM | POA: Diagnosis not present

## 2017-11-14 DIAGNOSIS — Z124 Encounter for screening for malignant neoplasm of cervix: Secondary | ICD-10-CM | POA: Diagnosis not present

## 2017-11-14 DIAGNOSIS — N950 Postmenopausal bleeding: Secondary | ICD-10-CM | POA: Diagnosis not present

## 2017-11-18 DIAGNOSIS — F250 Schizoaffective disorder, bipolar type: Secondary | ICD-10-CM | POA: Diagnosis not present

## 2017-11-21 ENCOUNTER — Ambulatory Visit (INDEPENDENT_AMBULATORY_CARE_PROVIDER_SITE_OTHER): Payer: Medicare Other | Admitting: Physician Assistant

## 2017-11-21 ENCOUNTER — Encounter: Payer: Self-pay | Admitting: Physician Assistant

## 2017-11-21 ENCOUNTER — Encounter (INDEPENDENT_AMBULATORY_CARE_PROVIDER_SITE_OTHER): Payer: Self-pay

## 2017-11-21 VITALS — BP 120/84 | HR 68 | Ht 67.32 in | Wt 181.4 lb

## 2017-11-21 DIAGNOSIS — R140 Abdominal distension (gaseous): Secondary | ICD-10-CM | POA: Diagnosis not present

## 2017-11-21 DIAGNOSIS — Z800 Family history of malignant neoplasm of digestive organs: Secondary | ICD-10-CM | POA: Diagnosis not present

## 2017-11-21 DIAGNOSIS — K5900 Constipation, unspecified: Secondary | ICD-10-CM

## 2017-11-21 NOTE — Progress Notes (Addendum)
Subjective:    Patient ID: Misty Campos, female    DOB: 28-May-1960, 57 y.o.   MRN: [id number]  HPI Misty Campos is a 57 year old white female, recently established with Dr. Hilarie Fredrickson who comes in today for one-month follow-up.  She was seen on 10/18/2017 with complaints of constipation and lower abdominal bloating.  She was started on a trial of Linzess 72 mcg daily. She has not had prior colonoscopy and does have family history of colon cancer in her mother.  She was counseled at the time of last office visit and strongly recommended to have colon cancer screening.  Colonoscopy and virtual colonoscopy as well as barium enema were discussed.  She wanted to think about it. She comes in today stating that her constipation symptoms are better than that she is having some bowel movement every day though does not think she completely evacuates her bowels.  If she takes the Linzess every day she will sometimes get diarrhea and she also thinks that her bloating is better but it has not subsided.  She seems quite anxious about the bloating and all the possibilities as to its etiology. She was reassured that CT of the abdomen and the pelvis was negative in March 2019. She does not feel that she is lactose intolerant, states she eats a healthy diet.  Does use occasional artificial sweeteners, she is not aware of any specific food intolerances.  She does say that the bloating improves with bowel evacuation.  She mentions that perhaps she would feel better if she purged her bowels but did not seem interested in pursuing that when I suggested it. We had long conversation regarding options again for colon cancer screening..  Review of Systems Pertinent positive and negative review of systems were noted in the above HPI section.  All other review of systems was otherwise negative.  Outpatient Encounter Medications as of 11/21/2017  Medication Sig  . acyclovir (ZOVIRAX) 400 MG tablet Take 400 mg by mouth 2 (two)  times daily.  . clonazePAM (KLONOPIN) 0.5 MG tablet Take 1 tablet by mouth 3 (three) times daily as needed.  . gabapentin (NEURONTIN) 100 MG capsule Take 1 capsule by mouth 2 (two) times daily.  Marland Kitchen linaclotide (LINZESS) 72 MCG capsule Take 1 capsule (72 mcg total) by mouth daily before breakfast.   No facility-administered encounter medications on file as of 11/21/2017.    No Known Allergies There are no active problems to display for this patient.  Social History   Socioeconomic History  . Marital status: Single    Spouse name: Not on file  . Number of children: Not on file  . Years of education: Not on file  . Highest education level: Not on file  Occupational History  . Not on file  Social Needs  . Financial resource strain: Not on file  . Food insecurity:    Worry: Not on file    Inability: Not on file  . Transportation needs:    Medical: Not on file    Non-medical: Not on file  Tobacco Use  . Smoking status: Never Smoker  . Smokeless tobacco: Never Used  Substance and Sexual Activity  . Alcohol use: No  . Drug use: Not Currently  . Sexual activity: Never  Lifestyle  . Physical activity:    Days per week: Not on file    Minutes per session: Not on file  . Stress: Not on file  Relationships  . Social connections:    Talks on phone:  Not on file    Gets together: Not on file    Attends religious service: Not on file    Active member of club or organization: Not on file    Attends meetings of clubs or organizations: Not on file    Relationship status: Not on file  . Intimate partner violence:    Fear of current or ex partner: Not on file    Emotionally abused: Not on file    Physically abused: Not on file    Forced sexual activity: Not on file  Other Topics Concern  . Not on file  Social History Narrative  . Not on file    Misty Campos's family history includes Colon cancer in her maternal grandfather and maternal grandmother; Dementia in her father; Other  in her mother; Pancreatic cancer in her maternal grandfather.      Objective:    Vitals:   11/21/17 0922  BP: 120/84  Pulse: 68    Physical Exam; well-developed white female in no acute distress, pleasant very talkative with rapid speech blood pressure 120/84 pulse 68, BMI 28.1.  HEENT; nontraumatic normocephalic EOMI PERRLA sclera anicteric oral mucosa moist, Cardiovascular ;regular rate and rhythm with S1-S2 no murmur rub or gallop, Pulmonary ;clear bilaterally, Abdomen; soft, nontender no palpable mass or hepatosplenomegaly bowel sounds are present Rectal ;exam not done, Extremities; no clubbing cyanosis or edema skin warm dry, Neuro psych ;alert and oriented, grossly nonfocal , talkative with rapid speech pattern, appears anxious       Assessment & Plan:   #56 57 year old white female with constipation and lower abdominal bloating-improved but not resolved with Linzess 72 mcg daily. #2 colon cancer screening/high risk with family history of colon cancer in patient's grandmother ?mother #3.  Bipolar disorder  Plan; She will continue Linzess 72 mcg daily, she will hold for 1 day for diarrhea. We discussed a bowel purge but she does not want to pursue this at present. Patient had multiple questions, and we had an extensive conversation again about options for colon cancer screening are higher than average risk given family history of colon cancer. She is unable to make a decision at present but would like to revisit this issue in a couple of months. I asked her to review her options again at home perhaps discussed with her mother and then call back if she decides to pursue colonoscopy or virtual colonoscopy and we can get this scheduled.  She is aware that insurance most likely will not cover the virtual colonoscopy.  I also gave her samples of IBgard to try for bloating 2 p.o. 30 minutes before meals 2-3 times daily.  Greater than 50% of this visit was spent in discussion, education and  counseling regarding patient's symptoms and colon cancer surveillance. Amy S Esterwood PA-C 11/21/2017   Cc: [facility]   Addendum: Reviewed and agree with management. Pyrtle, Lajuan Lines, MD

## 2017-11-21 NOTE — Patient Instructions (Addendum)
We have given you samples of IB GARD  Take 2 capsules twice daily. You can get this at your pharmacy, [facility], [place], [address] [address].   Take the Linzess 72 mcg- Take 1 capsule by mouth every morning.   Follow up with Dr. Hilarie Fredrickson in December.  Call early November for an appointment in December.  Please consider a colonoscopy vs a Virtual colonoscopy and call back to schedule.   Normal BMI (Body Mass Index- based on height and weight) is between 19 and 25. Your BMI today is Body mass index is 28.14 kg/m. Marland Kitchen Please consider follow up  regarding your BMI with your Primary Care Provider.

## 2017-11-28 DIAGNOSIS — Z1231 Encounter for screening mammogram for malignant neoplasm of breast: Secondary | ICD-10-CM | POA: Diagnosis not present

## 2017-11-30 DIAGNOSIS — F419 Anxiety disorder, unspecified: Secondary | ICD-10-CM | POA: Diagnosis not present

## 2017-11-30 DIAGNOSIS — F250 Schizoaffective disorder, bipolar type: Secondary | ICD-10-CM | POA: Diagnosis not present

## 2017-12-12 DIAGNOSIS — R140 Abdominal distension (gaseous): Secondary | ICD-10-CM | POA: Diagnosis not present

## 2017-12-12 DIAGNOSIS — N950 Postmenopausal bleeding: Secondary | ICD-10-CM | POA: Diagnosis not present

## 2017-12-15 ENCOUNTER — Telehealth: Payer: Self-pay | Admitting: Physician Assistant

## 2017-12-20 ENCOUNTER — Other Ambulatory Visit: Payer: Self-pay

## 2017-12-20 DIAGNOSIS — Z1211 Encounter for screening for malignant neoplasm of colon: Secondary | ICD-10-CM

## 2017-12-20 DIAGNOSIS — R194 Change in bowel habit: Secondary | ICD-10-CM

## 2017-12-20 NOTE — Telephone Encounter (Signed)
Advised she will receive a call from [facility].  Confirmed the order is visible in the work que by [employer].

## 2017-12-27 DIAGNOSIS — F419 Anxiety disorder, unspecified: Secondary | ICD-10-CM | POA: Diagnosis not present

## 2017-12-27 DIAGNOSIS — F250 Schizoaffective disorder, bipolar type: Secondary | ICD-10-CM | POA: Diagnosis not present

## 2018-01-04 DIAGNOSIS — F419 Anxiety disorder, unspecified: Secondary | ICD-10-CM | POA: Diagnosis not present

## 2018-01-04 DIAGNOSIS — F429 Obsessive-compulsive disorder, unspecified: Secondary | ICD-10-CM | POA: Diagnosis not present

## 2018-01-04 DIAGNOSIS — F250 Schizoaffective disorder, bipolar type: Secondary | ICD-10-CM | POA: Diagnosis not present

## 2018-01-09 DIAGNOSIS — Z0000 Encounter for general adult medical examination without abnormal findings: Secondary | ICD-10-CM | POA: Diagnosis not present

## 2018-01-09 DIAGNOSIS — F3181 Bipolar II disorder: Secondary | ICD-10-CM | POA: Diagnosis not present

## 2018-01-09 DIAGNOSIS — E785 Hyperlipidemia, unspecified: Secondary | ICD-10-CM | POA: Diagnosis not present

## 2018-01-09 DIAGNOSIS — Z5181 Encounter for therapeutic drug level monitoring: Secondary | ICD-10-CM | POA: Diagnosis not present

## 2018-01-09 DIAGNOSIS — K5900 Constipation, unspecified: Secondary | ICD-10-CM | POA: Diagnosis not present

## 2018-01-12 ENCOUNTER — Ambulatory Visit: Payer: Medicare Other

## 2018-01-12 DIAGNOSIS — N949 Unspecified condition associated with female genital organs and menstrual cycle: Secondary | ICD-10-CM | POA: Diagnosis not present

## 2018-01-13 ENCOUNTER — Inpatient Hospital Stay: Admission: RE | Admit: 2018-01-13 | Payer: Medicare Other | Source: Ambulatory Visit

## 2018-01-17 DIAGNOSIS — F3181 Bipolar II disorder: Secondary | ICD-10-CM | POA: Diagnosis not present

## 2018-01-17 DIAGNOSIS — F419 Anxiety disorder, unspecified: Secondary | ICD-10-CM | POA: Diagnosis not present

## 2018-01-17 DIAGNOSIS — N3091 Cystitis, unspecified with hematuria: Secondary | ICD-10-CM | POA: Diagnosis not present

## 2018-01-18 DIAGNOSIS — F250 Schizoaffective disorder, bipolar type: Secondary | ICD-10-CM | POA: Diagnosis not present

## 2018-01-18 DIAGNOSIS — F3181 Bipolar II disorder: Secondary | ICD-10-CM | POA: Diagnosis not present

## 2018-01-18 DIAGNOSIS — F419 Anxiety disorder, unspecified: Secondary | ICD-10-CM | POA: Diagnosis not present

## 2018-01-18 DIAGNOSIS — F429 Obsessive-compulsive disorder, unspecified: Secondary | ICD-10-CM | POA: Diagnosis not present

## 2018-01-23 ENCOUNTER — Ambulatory Visit: Payer: Medicare Other

## 2018-01-24 DIAGNOSIS — N3000 Acute cystitis without hematuria: Secondary | ICD-10-CM | POA: Diagnosis not present

## 2018-01-24 DIAGNOSIS — Z09 Encounter for follow-up examination after completed treatment for conditions other than malignant neoplasm: Secondary | ICD-10-CM | POA: Diagnosis not present

## 2018-01-24 DIAGNOSIS — F3181 Bipolar II disorder: Secondary | ICD-10-CM | POA: Diagnosis not present

## 2018-01-26 DIAGNOSIS — F429 Obsessive-compulsive disorder, unspecified: Secondary | ICD-10-CM | POA: Diagnosis not present

## 2018-01-26 DIAGNOSIS — F419 Anxiety disorder, unspecified: Secondary | ICD-10-CM | POA: Diagnosis not present

## 2018-01-26 DIAGNOSIS — F250 Schizoaffective disorder, bipolar type: Secondary | ICD-10-CM | POA: Diagnosis not present

## 2018-01-30 DIAGNOSIS — F250 Schizoaffective disorder, bipolar type: Secondary | ICD-10-CM | POA: Diagnosis not present

## 2018-01-30 DIAGNOSIS — F429 Obsessive-compulsive disorder, unspecified: Secondary | ICD-10-CM | POA: Diagnosis not present

## 2018-01-30 DIAGNOSIS — F419 Anxiety disorder, unspecified: Secondary | ICD-10-CM | POA: Diagnosis not present

## 2018-01-31 DIAGNOSIS — F419 Anxiety disorder, unspecified: Secondary | ICD-10-CM | POA: Diagnosis not present

## 2018-01-31 DIAGNOSIS — F250 Schizoaffective disorder, bipolar type: Secondary | ICD-10-CM | POA: Diagnosis not present

## 2018-02-07 DIAGNOSIS — F250 Schizoaffective disorder, bipolar type: Secondary | ICD-10-CM | POA: Diagnosis not present

## 2018-02-14 DIAGNOSIS — F419 Anxiety disorder, unspecified: Secondary | ICD-10-CM | POA: Diagnosis not present

## 2018-02-14 DIAGNOSIS — F250 Schizoaffective disorder, bipolar type: Secondary | ICD-10-CM | POA: Diagnosis not present

## 2018-03-02 DIAGNOSIS — F419 Anxiety disorder, unspecified: Secondary | ICD-10-CM | POA: Diagnosis not present

## 2018-03-02 DIAGNOSIS — F250 Schizoaffective disorder, bipolar type: Secondary | ICD-10-CM | POA: Diagnosis not present

## 2018-03-02 DIAGNOSIS — F429 Obsessive-compulsive disorder, unspecified: Secondary | ICD-10-CM | POA: Diagnosis not present

## 2018-03-03 DIAGNOSIS — H01112 Allergic dermatitis of right lower eyelid: Secondary | ICD-10-CM | POA: Diagnosis not present

## 2018-03-03 DIAGNOSIS — H01111 Allergic dermatitis of right upper eyelid: Secondary | ICD-10-CM | POA: Diagnosis not present

## 2018-03-14 DIAGNOSIS — H40023 Open angle with borderline findings, high risk, bilateral: Secondary | ICD-10-CM | POA: Diagnosis not present

## 2018-03-14 DIAGNOSIS — H1851 Endothelial corneal dystrophy: Secondary | ICD-10-CM | POA: Diagnosis not present

## 2018-04-03 ENCOUNTER — Other Ambulatory Visit: Payer: Self-pay

## 2018-04-03 ENCOUNTER — Emergency Department (HOSPITAL_BASED_OUTPATIENT_CLINIC_OR_DEPARTMENT_OTHER)
Admission: EM | Admit: 2018-04-03 | Discharge: 2018-04-03 | Disposition: A | Discharge status: Home / Self Care | Payer: Medicare Other | Attending: Emergency Medicine | Admitting: Emergency Medicine

## 2018-04-03 ENCOUNTER — Encounter (HOSPITAL_BASED_OUTPATIENT_CLINIC_OR_DEPARTMENT_OTHER): Payer: Self-pay | Admitting: *Deleted

## 2018-04-03 ENCOUNTER — Emergency Department (HOSPITAL_BASED_OUTPATIENT_CLINIC_OR_DEPARTMENT_OTHER): Payer: Medicare Other

## 2018-04-03 DIAGNOSIS — F419 Anxiety disorder, unspecified: Secondary | ICD-10-CM | POA: Diagnosis not present

## 2018-04-03 DIAGNOSIS — R1084 Generalized abdominal pain: Secondary | ICD-10-CM

## 2018-04-03 DIAGNOSIS — K310 Acute dilatation of stomach: Secondary | ICD-10-CM | POA: Diagnosis not present

## 2018-04-03 DIAGNOSIS — K5900 Constipation, unspecified: Secondary | ICD-10-CM | POA: Diagnosis not present

## 2018-04-03 DIAGNOSIS — R140 Abdominal distension (gaseous): Secondary | ICD-10-CM | POA: Diagnosis not present

## 2018-04-03 DIAGNOSIS — F429 Obsessive-compulsive disorder, unspecified: Secondary | ICD-10-CM | POA: Diagnosis not present

## 2018-04-03 DIAGNOSIS — Z79899 Other long term (current) drug therapy: Secondary | ICD-10-CM | POA: Diagnosis not present

## 2018-04-03 DIAGNOSIS — R109 Unspecified abdominal pain: Secondary | ICD-10-CM | POA: Diagnosis present

## 2018-04-03 DIAGNOSIS — F250 Schizoaffective disorder, bipolar type: Secondary | ICD-10-CM | POA: Diagnosis not present

## 2018-04-03 LAB — LIPASE, BLOOD: Lipase: 43 U/L (ref 11–51)

## 2018-04-03 LAB — COMPREHENSIVE METABOLIC PANEL
ALT: 28 U/L (ref 0–44)
AST: 21 U/L (ref 15–41)
Albumin: 4.3 g/dL (ref 3.5–5.0)
Alkaline Phosphatase: 94 U/L (ref 38–126)
Anion gap: 7 (ref 5–15)
BUN: 13 mg/dL (ref 6–20)
CO2: 24 mmol/L (ref 22–32)
Calcium: 9.4 mg/dL (ref 8.9–10.3)
Chloride: 108 mmol/L (ref 98–111)
Creatinine, Ser: 0.71 mg/dL (ref 0.44–1.00)
GFR calc Af Amer: >60 mL/min (ref >60)
GFR calc non Af Amer: >60 mL/min (ref >60)
Glucose, Bld: 92 mg/dL (ref 70–99)
Potassium: 3.7 mmol/L (ref 3.5–5.1)
Sodium: 139 mmol/L (ref 135–145)
Total Bilirubin: 0.3 mg/dL (ref 0.3–1.2)
Total Protein: 7.5 g/dL (ref 6.5–8.1)

## 2018-04-03 LAB — CBC WITH DIFFERENTIAL/PLATELET
Abs Immature Granulocytes: 0.01 10*3/uL (ref 0.00–0.07)
Basophils Absolute: 0.1 10*3/uL (ref 0.0–0.1)
Basophils Relative: 1 %
Eosinophils Absolute: 0.1 10*3/uL (ref 0.0–0.5)
Eosinophils Relative: 2 %
HCT: 38.9 % (ref 36.0–46.0)
Hemoglobin: 12.6 g/dL (ref 12.0–15.0)
Immature Granulocytes: 0 %
Lymphocytes Relative: 37 %
Lymphs Abs: 2.4 10*3/uL (ref 0.7–4.0)
MCH: 28.8 pg (ref 26.0–34.0)
MCHC: 32.4 g/dL (ref 30.0–36.0)
MCV: 89 fL (ref 80.0–100.0)
Monocytes Absolute: 0.5 10*3/uL (ref 0.1–1.0)
Monocytes Relative: 7 %
Neutro Abs: 3.4 10*3/uL (ref 1.7–7.7)
Neutrophils Relative %: 53 %
Platelets: 223 10*3/uL (ref 150–400)
RBC: 4.37 MIL/uL (ref 3.87–5.11)
RDW: 13.2 % (ref 11.5–15.5)
WBC: 6.5 10*3/uL (ref 4.0–10.5)
nRBC: 0 % (ref 0.0–0.2)

## 2018-04-03 MED ORDER — ALUM & MAG HYDROXIDE-SIMETH 200-200-20 MG/5ML PO SUSP
30.0000 mL | Freq: Once | ORAL | Status: AC
  Administered 2018-04-03: 30 mL via ORAL
  Filled 2018-04-03: qty 30

## 2018-04-03 MED ORDER — SODIUM CHLORIDE 0.9 % IV BOLUS
500.0000 mL | Freq: Once | INTRAVENOUS | Status: AC
  Administered 2018-04-03: 500 mL via INTRAVENOUS

## 2018-04-03 MED ORDER — IOPAMIDOL (ISOVUE-300) INJECTION 61%
100.0000 mL | Freq: Once | INTRAVENOUS | Status: AC | PRN
  Administered 2018-04-03: 100 mL via INTRAVENOUS

## 2018-04-03 NOTE — Discharge Instructions (Addendum)
You may take miralax 4 caps in 1 32oz bottle of gatorade, and the next day 3 caps, and the next day 2 or until you have one soft stool daily.

## 2018-04-03 NOTE — ED Provider Notes (Signed)
[facility] EMERGENCY DEPARTMENT Provider Note   CSN: [id number] Arrival date & time: 04/03/18  1716     History   Chief Complaint Chief Complaint  Patient presents with  . Abdominal Pain    HPI Misty Campos is a 58 y.o. female.  HPI  Came off of fluoxetine 2 years ago Took linzess an hour ago Has had a struggle with bowel irregularity since coming off of the medication Taken miralax, dulcolax, linzess. Just linzess today.    Bloating, severe pain, uncomfortable, feels impacted, like 2-3 mos pregnant, began feeling this way about 2 hours prior to coming, reports having normal BM 530AM No nausae or vomiting,  No urinary symptoms, no fevers, No hx of abdominal surgery, no change in meds.  Is not passing flatus today. Feels like she took immodium but has not.     Waiting to get virtual colonoscopy, grandparents and brother had colonosocpy, concern about sedation, discussed with Dr. Hilarie Fredrickson in November, had recommended prn linzess and virtual colonoscopy  Past Medical History:  Diagnosis Date  . Anxiety   . Bipolar disorder ([place])   . Liver enlargement 04/2017    There are no active problems to display for this patient.   Past Surgical History:  Procedure Laterality Date  . TONSILLECTOMY AND ADENOIDECTOMY       OB History   No obstetric history on file.      Home Medications    Prior to Admission medications   Medication Sig Start Date End Date Taking? Authorizing Provider  acyclovir (ZOVIRAX) 400 MG tablet Take 400 mg by mouth 2 (two) times daily. 09/12/17   [provider]  clonazePAM (KLONOPIN) 0.5 MG tablet Take 1 tablet by mouth 3 (three) times daily as needed. 09/07/17   [provider]  gabapentin (NEURONTIN) 100 MG capsule Take 1 capsule by mouth 2 (two) times daily. 10/11/17   [provider]  linaclotide Rolan Lipa) 72 MCG capsule Take 1 capsule (72 mcg total) by mouth daily before breakfast. 10/18/17   Pyrtle,  Lajuan Lines, MD    Family History Family History  Problem Relation Age of Onset  . Other Mother        meningioma  . Colon cancer Maternal Grandmother   . Colon cancer Maternal Grandfather   . Pancreatic cancer Maternal Grandfather   . Dementia Father     Social History Social History   Tobacco Use  . Smoking status: Never Smoker  . Smokeless tobacco: Never Used  Substance Use Topics  . Alcohol use: No  . Drug use: Not Currently     Allergies   Patient has no known allergies.   Review of Systems Review of Systems  Constitutional: Negative for fever.  HENT: Negative for sore throat.   Eyes: Negative for visual disturbance.  Respiratory: Negative for cough and shortness of breath.   Cardiovascular: Negative for chest pain.  Gastrointestinal: Positive for abdominal pain and constipation. Negative for diarrhea, nausea and vomiting.  Genitourinary: Negative for difficulty urinating.  Musculoskeletal: Negative for back pain and neck pain.  Skin: Negative for rash.  Neurological: Negative for syncope and headaches.     Physical Exam Updated Vital Signs BP 132/69 (BP Location: Left Arm)   Pulse 72   Temp 98.6 F (37 C) (Oral)   Resp 18   Ht 5' 7.5" (1.715 m)   Wt 79.4 kg   SpO2 98%   BMI 27.00 kg/m   Physical Exam Vitals signs and nursing note reviewed.  Constitutional:      General: She is not in acute distress.    Appearance: She is well-developed. She is not diaphoretic.  HENT:     Head: Normocephalic and atraumatic.  Eyes:     Conjunctiva/sclera: Conjunctivae normal.  Neck:     Musculoskeletal: Normal range of motion.  Cardiovascular:     Rate and Rhythm: Normal rate and regular rhythm.     Heart sounds: Normal heart sounds. No murmur. No friction rub. No gallop.   Pulmonary:     Effort: Pulmonary effort is normal. No respiratory distress.     Breath sounds: Normal breath sounds. No wheezing or rales.  Abdominal:     General: There is no distension.       Palpations: Abdomen is soft.     Tenderness: There is generalized abdominal tenderness. There is no guarding.  Musculoskeletal:        General: No tenderness.  Skin:    General: Skin is warm and dry.     Findings: No erythema or rash.  Neurological:     Mental Status: She is alert and oriented to person, place, and time.      ED Treatments / Results  Labs (all labs ordered are listed, but only abnormal results are displayed) Labs Reviewed  CBC WITH DIFFERENTIAL/PLATELET  COMPREHENSIVE METABOLIC PANEL  LIPASE, BLOOD    EKG None  Radiology Ct Abdomen Pelvis W Contrast  Result Date: 04/03/2018 CLINICAL DATA:  General abdominal pain, bloating and constipation today. Question bowel obstruction. EXAM: CT ABDOMEN AND PELVIS WITH CONTRAST TECHNIQUE: Multidetector CT imaging of the abdomen and pelvis was performed using the standard protocol following bolus administration of intravenous contrast. CONTRAST:  100 mL ISOVUE-300 IOPAMIDOL (ISOVUE-300) INJECTION 61% COMPARISON:  CT abdomen and pelvis 05/02/2017. FINDINGS: Lower chest: Lung bases are clear. Heart size is normal. No pleural or pericardial effusion Hepatobiliary: No focal liver abnormality is seen. No gallstones, gallbladder wall thickening, or biliary dilatation. Pancreas: Unremarkable. No pancreatic ductal dilatation or surrounding inflammatory changes. Spleen: Normal in size without focal abnormality. Adrenals/Urinary Tract: Adrenal glands are unremarkable. Kidneys are normal, without renal calculi, focal lesion, or hydronephrosis. Bladder is unremarkable. Stomach/Bowel: Stomach is within normal limits. Appendix appears normal. No evidence of bowel wall thickening, distention, or inflammatory changes. Vascular/Lymphatic: No significant vascular findings are present. No enlarged abdominal or pelvic lymph nodes. Reproductive: Uterus and bilateral adnexa are unremarkable. Other: None. Musculoskeletal: No acute or focal abnormality.  IMPRESSION: Negative CT abdomen and pelvis. No finding to explain the patient's symptoms. Electronically Signed   By: Inge Rise M.D.   On: 04/03/2018 20:26    Procedures Procedures (including critical care time)  Medications Ordered in ED Medications  sodium chloride 0.9 % bolus 500 mL (0 mLs Intravenous Stopped 04/03/18 1922)  alum & mag hydroxide-simeth (MAALOX/MYLANTA) 200-200-20 MG/5ML suspension 30 mL (30 mLs Oral Given 04/03/18 1901)  iopamidol (ISOVUE-300) 61 % injection 100 mL (100 mLs Intravenous Contrast Given 04/03/18 1953)     Initial Impression / Assessment and Plan / ED Course  I have reviewed the triage vital signs and the nursing notes.  Pertinent labs & imaging results that were available during my care of the patient were reviewed by me and considered in my medical decision making (see chart for details).     58 yo female with history of bipolar who presents with concern for abdominal pain and constipation.  Labs without acute abnormalities.   CT abdomen/pelvis shows no evidence of obstruction or other  abnormalities.  Recommend hydration, fiber, walking, miralax, GI follow up.  Patient discharged in stable condition with understanding of reasons to return.  Final Clinical Impressions(s) / ED Diagnoses   Final diagnoses:  Abdominal distention  Generalized abdominal pain  Constipation, unspecified constipation type    ED Discharge Orders    None       Gareth Morgan, MD 04/04/18 1018

## 2018-04-03 NOTE — ED Triage Notes (Signed)
Abdominal pain and bloating. Constipated.

## 2018-04-05 DIAGNOSIS — K581 Irritable bowel syndrome with constipation: Secondary | ICD-10-CM | POA: Diagnosis not present

## 2018-04-17 ENCOUNTER — Ambulatory Visit
Admission: RE | Admit: 2018-04-17 | Discharge: 2018-04-17 | Disposition: A | Discharge status: Home / Self Care | Payer: Medicare Other | Source: Ambulatory Visit | Attending: Physician Assistant | Admitting: Physician Assistant

## 2018-04-17 DIAGNOSIS — R194 Change in bowel habit: Secondary | ICD-10-CM

## 2018-04-17 DIAGNOSIS — Z1211 Encounter for screening for malignant neoplasm of colon: Secondary | ICD-10-CM

## 2018-04-17 DIAGNOSIS — N200 Calculus of kidney: Secondary | ICD-10-CM | POA: Diagnosis not present

## 2018-04-17 DIAGNOSIS — D3500 Benign neoplasm of unspecified adrenal gland: Secondary | ICD-10-CM | POA: Diagnosis not present

## 2018-04-26 DIAGNOSIS — K581 Irritable bowel syndrome with constipation: Secondary | ICD-10-CM | POA: Diagnosis not present

## 2018-04-26 DIAGNOSIS — F250 Schizoaffective disorder, bipolar type: Secondary | ICD-10-CM | POA: Diagnosis not present

## 2018-05-05 DIAGNOSIS — H40023 Open angle with borderline findings, high risk, bilateral: Secondary | ICD-10-CM | POA: Diagnosis not present

## 2018-05-05 DIAGNOSIS — H1851 Endothelial corneal dystrophy: Secondary | ICD-10-CM | POA: Diagnosis not present

## 2018-05-16 DIAGNOSIS — F429 Obsessive-compulsive disorder, unspecified: Secondary | ICD-10-CM | POA: Diagnosis not present

## 2018-05-16 DIAGNOSIS — F419 Anxiety disorder, unspecified: Secondary | ICD-10-CM | POA: Diagnosis not present

## 2018-06-23 DIAGNOSIS — F419 Anxiety disorder, unspecified: Secondary | ICD-10-CM | POA: Diagnosis not present

## 2018-06-23 DIAGNOSIS — F250 Schizoaffective disorder, bipolar type: Secondary | ICD-10-CM | POA: Diagnosis not present

## 2018-06-23 DIAGNOSIS — F429 Obsessive-compulsive disorder, unspecified: Secondary | ICD-10-CM | POA: Diagnosis not present

## 2018-08-01 DIAGNOSIS — F419 Anxiety disorder, unspecified: Secondary | ICD-10-CM | POA: Diagnosis not present

## 2018-08-01 DIAGNOSIS — F250 Schizoaffective disorder, bipolar type: Secondary | ICD-10-CM | POA: Diagnosis not present

## 2018-08-01 DIAGNOSIS — F429 Obsessive-compulsive disorder, unspecified: Secondary | ICD-10-CM | POA: Diagnosis not present

## 2018-08-07 DIAGNOSIS — F250 Schizoaffective disorder, bipolar type: Secondary | ICD-10-CM | POA: Diagnosis not present

## 2018-09-14 DIAGNOSIS — F250 Schizoaffective disorder, bipolar type: Secondary | ICD-10-CM | POA: Diagnosis not present

## 2018-10-05 DIAGNOSIS — F419 Anxiety disorder, unspecified: Secondary | ICD-10-CM | POA: Diagnosis not present

## 2018-10-05 DIAGNOSIS — F250 Schizoaffective disorder, bipolar type: Secondary | ICD-10-CM | POA: Diagnosis not present

## 2018-10-05 DIAGNOSIS — F429 Obsessive-compulsive disorder, unspecified: Secondary | ICD-10-CM | POA: Diagnosis not present

## 2018-10-19 DIAGNOSIS — F429 Obsessive-compulsive disorder, unspecified: Secondary | ICD-10-CM | POA: Diagnosis not present

## 2018-10-19 DIAGNOSIS — F419 Anxiety disorder, unspecified: Secondary | ICD-10-CM | POA: Diagnosis not present

## 2018-10-19 DIAGNOSIS — F250 Schizoaffective disorder, bipolar type: Secondary | ICD-10-CM | POA: Diagnosis not present

## 2018-10-31 DIAGNOSIS — D229 Melanocytic nevi, unspecified: Secondary | ICD-10-CM | POA: Diagnosis not present

## 2018-10-31 DIAGNOSIS — L821 Other seborrheic keratosis: Secondary | ICD-10-CM | POA: Diagnosis not present

## 2018-10-31 DIAGNOSIS — D485 Neoplasm of uncertain behavior of skin: Secondary | ICD-10-CM | POA: Diagnosis not present

## 2018-10-31 DIAGNOSIS — L905 Scar conditions and fibrosis of skin: Secondary | ICD-10-CM | POA: Diagnosis not present

## 2018-10-31 DIAGNOSIS — D1801 Hemangioma of skin and subcutaneous tissue: Secondary | ICD-10-CM | POA: Diagnosis not present

## 2018-10-31 DIAGNOSIS — L814 Other melanin hyperpigmentation: Secondary | ICD-10-CM | POA: Diagnosis not present

## 2018-11-24 DIAGNOSIS — F250 Schizoaffective disorder, bipolar type: Secondary | ICD-10-CM | POA: Diagnosis not present

## 2018-11-24 DIAGNOSIS — F429 Obsessive-compulsive disorder, unspecified: Secondary | ICD-10-CM | POA: Diagnosis not present

## 2018-11-24 DIAGNOSIS — F419 Anxiety disorder, unspecified: Secondary | ICD-10-CM | POA: Diagnosis not present

## 2018-12-06 DIAGNOSIS — Z124 Encounter for screening for malignant neoplasm of cervix: Secondary | ICD-10-CM | POA: Diagnosis not present

## 2018-12-06 DIAGNOSIS — Z1231 Encounter for screening mammogram for malignant neoplasm of breast: Secondary | ICD-10-CM | POA: Diagnosis not present

## 2018-12-06 DIAGNOSIS — Z01419 Encounter for gynecological examination (general) (routine) without abnormal findings: Secondary | ICD-10-CM | POA: Diagnosis not present

## 2018-12-29 DIAGNOSIS — F250 Schizoaffective disorder, bipolar type: Secondary | ICD-10-CM | POA: Diagnosis not present

## 2019-02-02 DIAGNOSIS — F250 Schizoaffective disorder, bipolar type: Secondary | ICD-10-CM | POA: Diagnosis not present

## 2019-02-02 DIAGNOSIS — F419 Anxiety disorder, unspecified: Secondary | ICD-10-CM | POA: Diagnosis not present

## 2019-02-02 DIAGNOSIS — F429 Obsessive-compulsive disorder, unspecified: Secondary | ICD-10-CM | POA: Diagnosis not present

## 2019-02-27 DIAGNOSIS — I10 Essential (primary) hypertension: Secondary | ICD-10-CM | POA: Diagnosis not present

## 2019-03-16 DIAGNOSIS — H40023 Open angle with borderline findings, high risk, bilateral: Secondary | ICD-10-CM | POA: Diagnosis not present

## 2019-03-23 DIAGNOSIS — F419 Anxiety disorder, unspecified: Secondary | ICD-10-CM | POA: Diagnosis not present

## 2019-03-23 DIAGNOSIS — F250 Schizoaffective disorder, bipolar type: Secondary | ICD-10-CM | POA: Diagnosis not present

## 2019-03-26 DIAGNOSIS — M25572 Pain in left ankle and joints of left foot: Secondary | ICD-10-CM | POA: Diagnosis not present

## 2019-03-26 DIAGNOSIS — M67962 Unspecified disorder of synovium and tendon, left lower leg: Secondary | ICD-10-CM | POA: Diagnosis not present

## 2019-03-30 DIAGNOSIS — M25572 Pain in left ankle and joints of left foot: Secondary | ICD-10-CM | POA: Diagnosis not present

## 2019-04-02 DIAGNOSIS — M25572 Pain in left ankle and joints of left foot: Secondary | ICD-10-CM | POA: Diagnosis not present

## 2019-04-10 DIAGNOSIS — M25572 Pain in left ankle and joints of left foot: Secondary | ICD-10-CM | POA: Diagnosis not present

## 2019-04-17 DIAGNOSIS — M25572 Pain in left ankle and joints of left foot: Secondary | ICD-10-CM | POA: Diagnosis not present

## 2019-04-19 DIAGNOSIS — F250 Schizoaffective disorder, bipolar type: Secondary | ICD-10-CM | POA: Diagnosis not present

## 2019-04-20 DIAGNOSIS — M25572 Pain in left ankle and joints of left foot: Secondary | ICD-10-CM | POA: Diagnosis not present

## 2019-04-27 DIAGNOSIS — M25572 Pain in left ankle and joints of left foot: Secondary | ICD-10-CM | POA: Diagnosis not present

## 2019-05-02 DIAGNOSIS — M25572 Pain in left ankle and joints of left foot: Secondary | ICD-10-CM | POA: Diagnosis not present

## 2019-05-04 DIAGNOSIS — M25572 Pain in left ankle and joints of left foot: Secondary | ICD-10-CM | POA: Diagnosis not present

## 2019-05-04 DIAGNOSIS — M67962 Unspecified disorder of synovium and tendon, left lower leg: Secondary | ICD-10-CM | POA: Diagnosis not present

## 2019-05-04 DIAGNOSIS — M79652 Pain in left thigh: Secondary | ICD-10-CM | POA: Diagnosis not present

## 2019-05-08 DIAGNOSIS — M25572 Pain in left ankle and joints of left foot: Secondary | ICD-10-CM | POA: Diagnosis not present

## 2019-05-11 DIAGNOSIS — F250 Schizoaffective disorder, bipolar type: Secondary | ICD-10-CM | POA: Diagnosis not present

## 2019-05-11 DIAGNOSIS — F419 Anxiety disorder, unspecified: Secondary | ICD-10-CM | POA: Diagnosis not present

## 2019-05-14 DIAGNOSIS — M25572 Pain in left ankle and joints of left foot: Secondary | ICD-10-CM | POA: Diagnosis not present

## 2019-05-24 DIAGNOSIS — M25572 Pain in left ankle and joints of left foot: Secondary | ICD-10-CM | POA: Diagnosis not present

## 2019-06-01 DIAGNOSIS — F419 Anxiety disorder, unspecified: Secondary | ICD-10-CM | POA: Diagnosis not present

## 2019-06-01 DIAGNOSIS — F250 Schizoaffective disorder, bipolar type: Secondary | ICD-10-CM | POA: Diagnosis not present

## 2019-06-05 DIAGNOSIS — Z23 Encounter for immunization: Secondary | ICD-10-CM | POA: Diagnosis not present

## 2019-06-08 DIAGNOSIS — M25572 Pain in left ankle and joints of left foot: Secondary | ICD-10-CM | POA: Diagnosis not present

## 2019-06-14 DIAGNOSIS — F419 Anxiety disorder, unspecified: Secondary | ICD-10-CM | POA: Diagnosis not present

## 2019-06-14 DIAGNOSIS — F429 Obsessive-compulsive disorder, unspecified: Secondary | ICD-10-CM | POA: Diagnosis not present

## 2019-06-14 DIAGNOSIS — F250 Schizoaffective disorder, bipolar type: Secondary | ICD-10-CM | POA: Diagnosis not present

## 2019-06-22 DIAGNOSIS — M25572 Pain in left ankle and joints of left foot: Secondary | ICD-10-CM | POA: Diagnosis not present

## 2019-06-25 DIAGNOSIS — I10 Essential (primary) hypertension: Secondary | ICD-10-CM | POA: Diagnosis not present

## 2019-06-25 DIAGNOSIS — F3181 Bipolar II disorder: Secondary | ICD-10-CM | POA: Diagnosis not present

## 2019-06-25 DIAGNOSIS — E785 Hyperlipidemia, unspecified: Secondary | ICD-10-CM | POA: Diagnosis not present

## 2019-06-25 DIAGNOSIS — K581 Irritable bowel syndrome with constipation: Secondary | ICD-10-CM | POA: Diagnosis not present

## 2019-06-27 DIAGNOSIS — M25572 Pain in left ankle and joints of left foot: Secondary | ICD-10-CM | POA: Diagnosis not present

## 2019-07-03 DIAGNOSIS — Z23 Encounter for immunization: Secondary | ICD-10-CM | POA: Diagnosis not present

## 2019-07-04 DIAGNOSIS — F419 Anxiety disorder, unspecified: Secondary | ICD-10-CM | POA: Diagnosis not present

## 2019-07-04 DIAGNOSIS — F429 Obsessive-compulsive disorder, unspecified: Secondary | ICD-10-CM | POA: Diagnosis not present

## 2019-07-04 DIAGNOSIS — F250 Schizoaffective disorder, bipolar type: Secondary | ICD-10-CM | POA: Diagnosis not present

## 2019-07-04 DIAGNOSIS — M25572 Pain in left ankle and joints of left foot: Secondary | ICD-10-CM | POA: Diagnosis not present

## 2019-07-10 DIAGNOSIS — D485 Neoplasm of uncertain behavior of skin: Secondary | ICD-10-CM | POA: Diagnosis not present

## 2019-07-10 DIAGNOSIS — L820 Inflamed seborrheic keratosis: Secondary | ICD-10-CM | POA: Diagnosis not present

## 2019-07-18 DIAGNOSIS — M25572 Pain in left ankle and joints of left foot: Secondary | ICD-10-CM | POA: Diagnosis not present

## 2019-08-01 DIAGNOSIS — M25572 Pain in left ankle and joints of left foot: Secondary | ICD-10-CM | POA: Diagnosis not present

## 2019-08-27 IMAGING — CT CT ABD-PELV W/ CM
2 of 5 series · 16 of 46 positions shown, 18 images · IV contrast (APPLIED)
Comparison: CT abdomen and pelvis 05/02/2017.

CLINICAL DATA: General abdominal pain, bloating and constipation
today. Question bowel obstruction.

EXAM:
CT ABDOMEN AND PELVIS WITH CONTRAST
TECHNIQUE: Multidetector CT imaging of the abdomen and pelvis was performed
using the standard protocol following bolus administration of
intravenous contrast.
CONTRAST:  100 mL 0EIMVZ-ZYY IOPAMIDOL (0EIMVZ-ZYY) INJECTION 61%

[Series 2: axial st · axial · 0.72mm/px · z∈[-640,-230]mm · 13 of 94 slices shown, 15 images]
[im 6/94  soft-tissue]
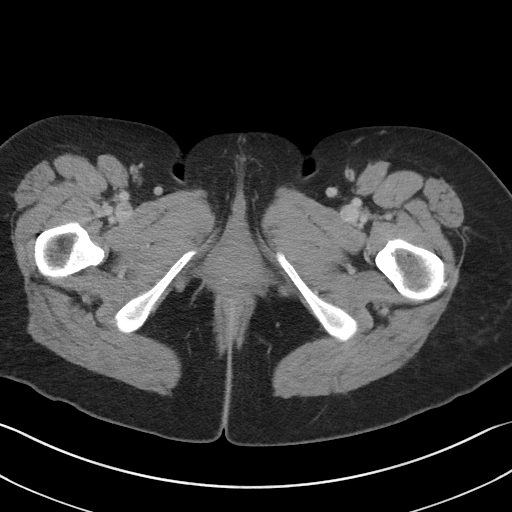
[im 6/94  bone]
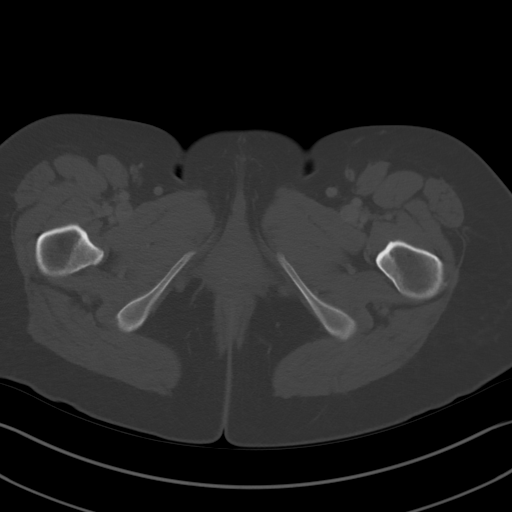
[im 12/94  soft-tissue]
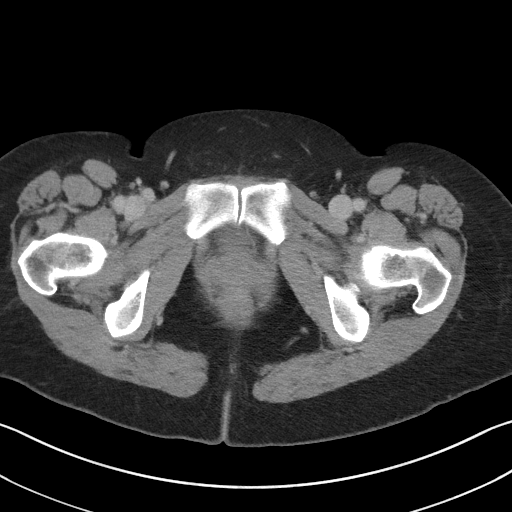
[im 18/94  soft-tissue]
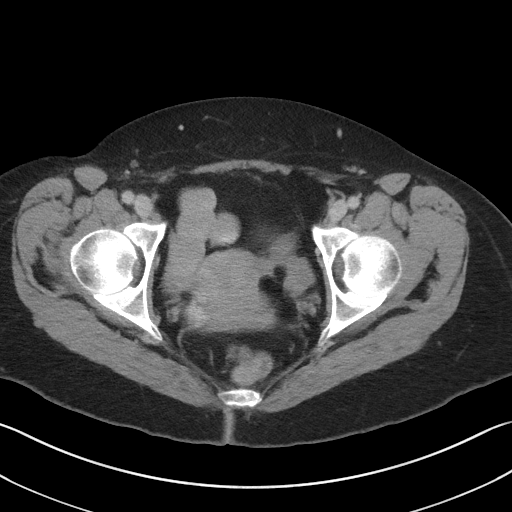
[im 30/94  soft-tissue]
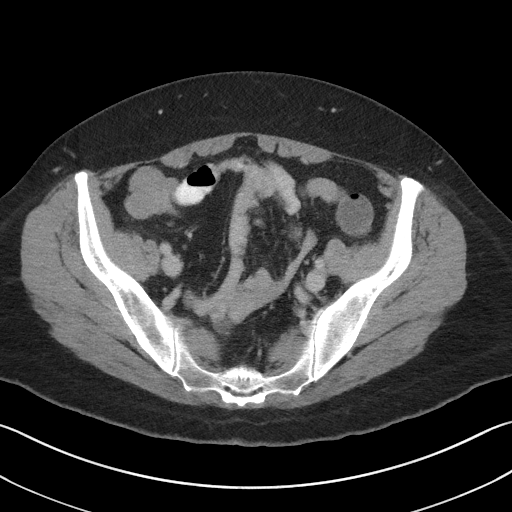
[im 35/94  soft-tissue]
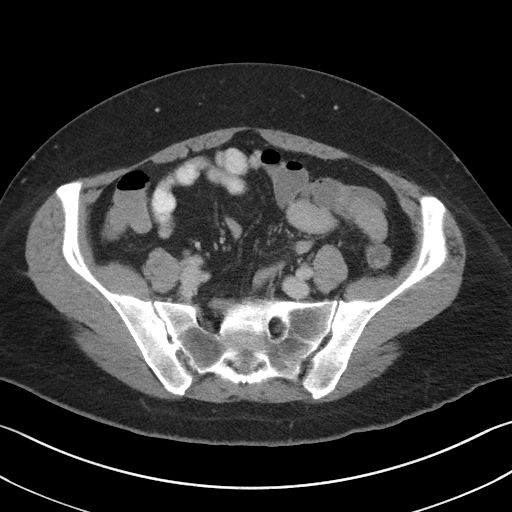
[im 41/94  soft-tissue]
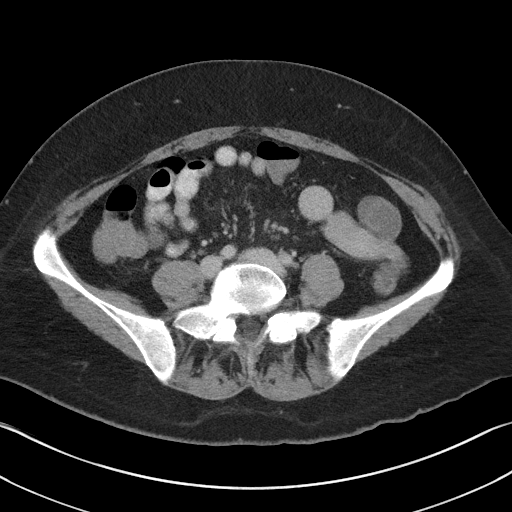
[im 47/94  soft-tissue]
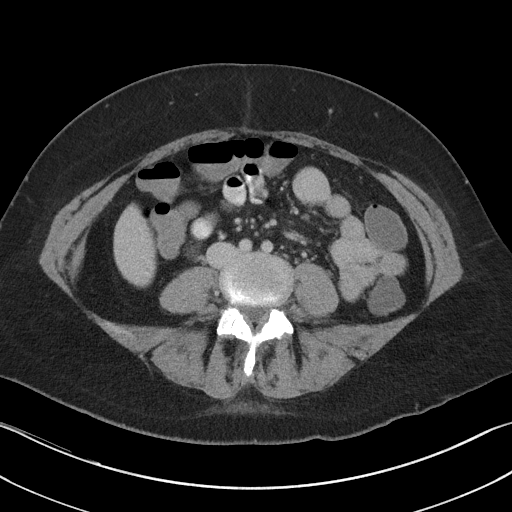
[im 53/94  soft-tissue]
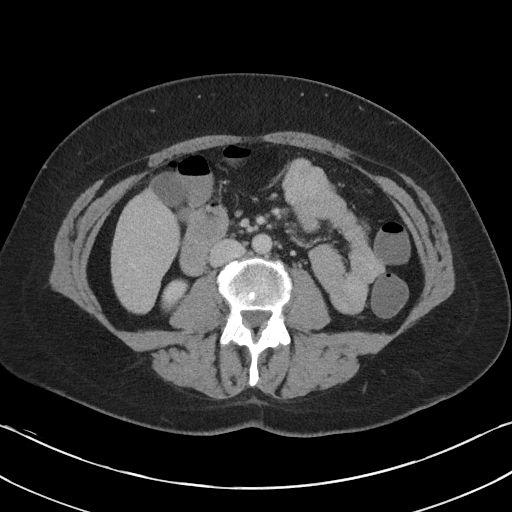
[im 59/94  soft-tissue]
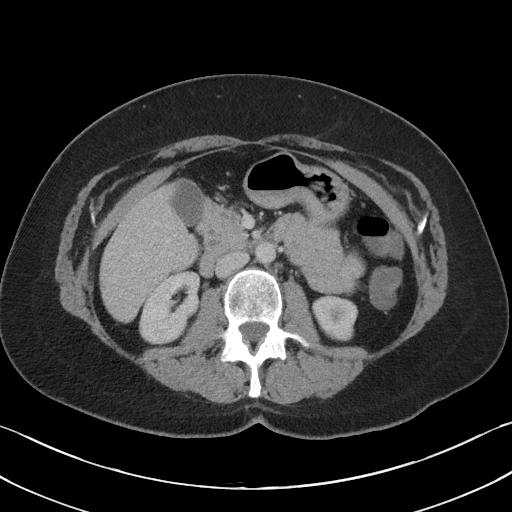
[im 59/94  bone]
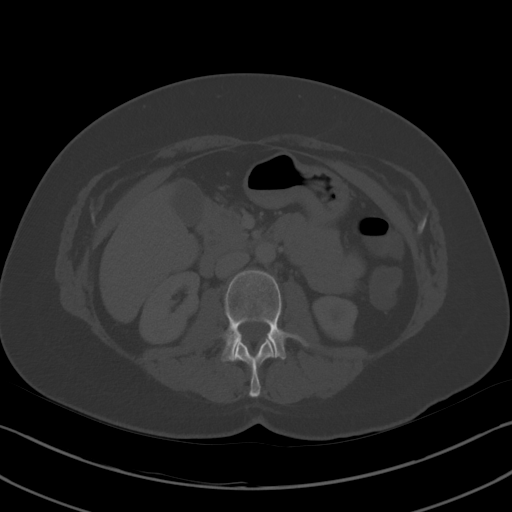
[im 64/94  soft-tissue]
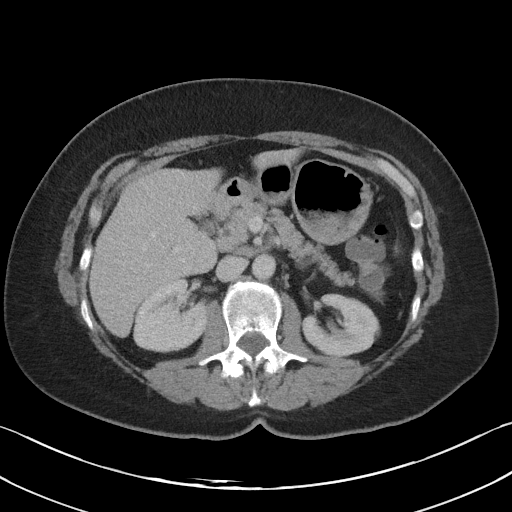
[im 76/94  soft-tissue]
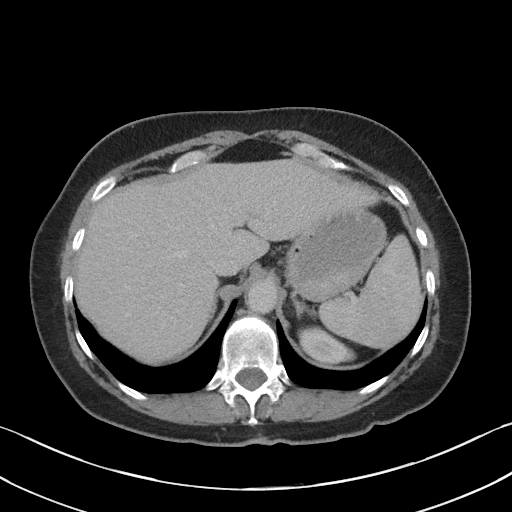
[im 82/94  soft-tissue]
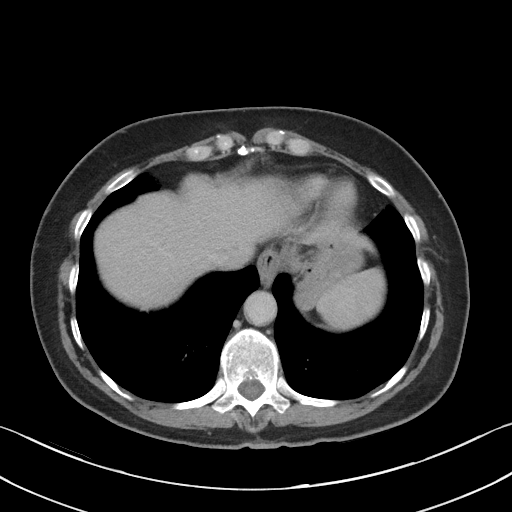
[im 88/94  soft-tissue]
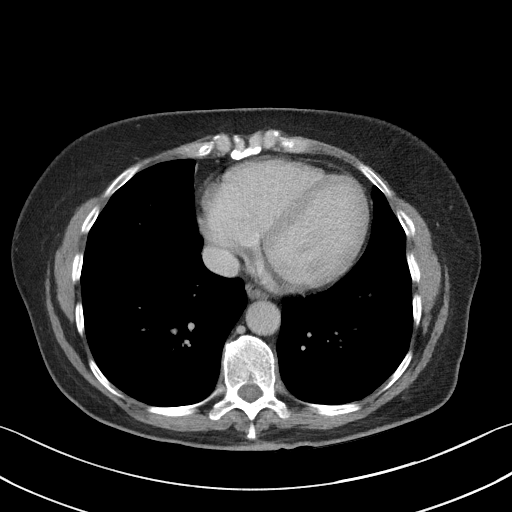

[Series 8: coronal st2 · coronal · 0.85mm/px · 3 of 101 slices shown]
[im 34/101  soft-tissue]
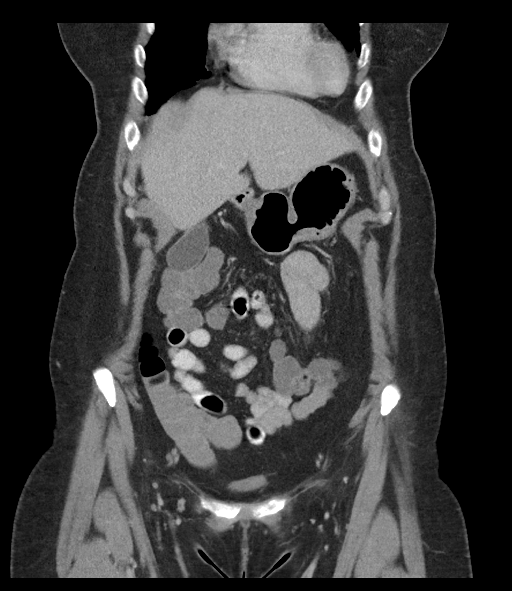
[im 45/101  soft-tissue]
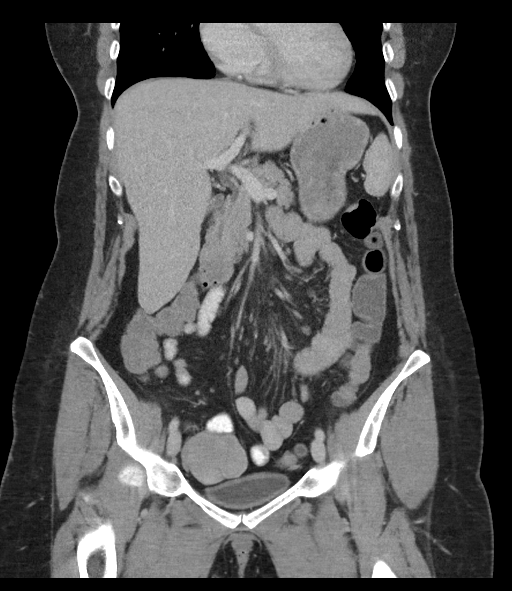
[im 56/101  soft-tissue]
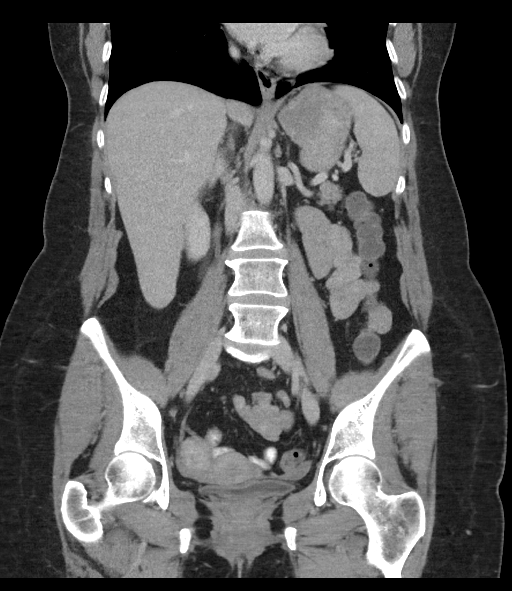

[16 of 46 positions shown; findings below may reference images not displayed]

FINDINGS: Lower chest: Lung bases are clear. Heart size is normal. No pleural
or pericardial effusion

Hepatobiliary: No focal liver abnormality is seen. No gallstones,
gallbladder wall thickening, or biliary dilatation.

Pancreas: Unremarkable. No pancreatic ductal dilatation or
surrounding inflammatory changes.

Spleen: Normal in size without focal abnormality.

Adrenals/Urinary Tract: Adrenal glands are unremarkable. Kidneys are
normal, without renal calculi, focal lesion, or hydronephrosis.
Bladder is unremarkable.

Stomach/Bowel: Stomach is within normal limits. Appendix appears
normal. No evidence of bowel wall thickening, distention, or
inflammatory changes.

Vascular/Lymphatic: No significant vascular findings are present. No
enlarged abdominal or pelvic lymph nodes.

Reproductive: Uterus and bilateral adnexa are unremarkable.

Other: None.

Musculoskeletal: No acute or focal abnormality.
IMPRESSION: Negative CT abdomen and pelvis. No finding to explain the patient's
symptoms.

## 2019-09-10 IMAGING — CT CT VIRTUAL COLONOSCOPY SCREENING
2 of 9 series · 12 of 46 positions shown, 18 images · non-contrast
Comparison: CT abdomen pelvis 04/03/2018.

CLINICAL DATA: Change in bowel habits, family history of
adenoma/carcinoma, colorectal cancer screening. Abdominal pain and
bloating for 2-3 years.

EXAM:
CT VIRTUAL COLONOSCOPY SCREENING
TECHNIQUE: The patient was given a standard LoSo bowel preparation with
Gastrografin and barium for fluid and stool tagging respectively.
The quality of the bowel preparation is excellent. Automated CO2
insufflation of the colon was performed prior to image acquisition
and colonic distention is poor to moderate in the sigmoid colon,
otherwise moderate to excellent. Image post processing was used to
generate a 3D endoluminal fly-through projection of the colon and to
electronically subtract stool/fluid as appropriate.

[Series 5: supine colon 3.00 br40 s3 cor cor supine · coronal · 0.68mm/px · 3 of 96 slices shown]
[im 24/96  soft-tissue]
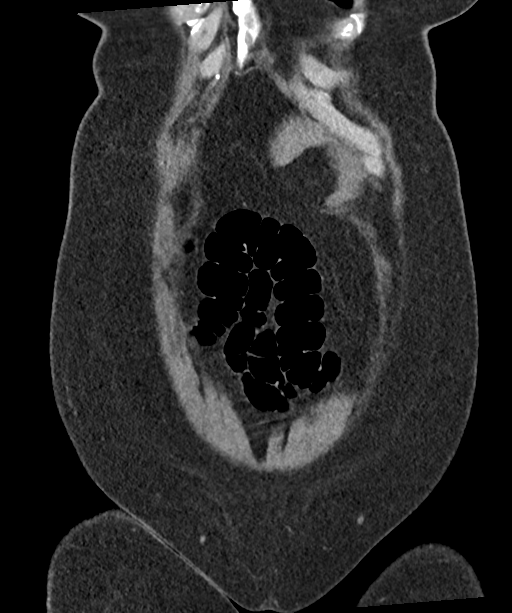
[im 48/96  soft-tissue]
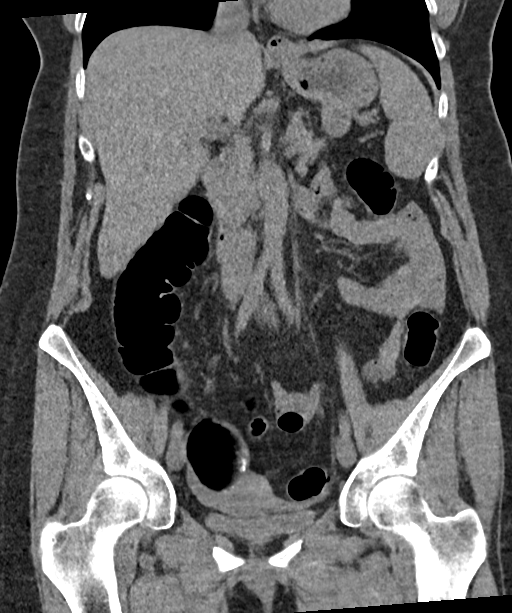
[im 72/96  soft-tissue]
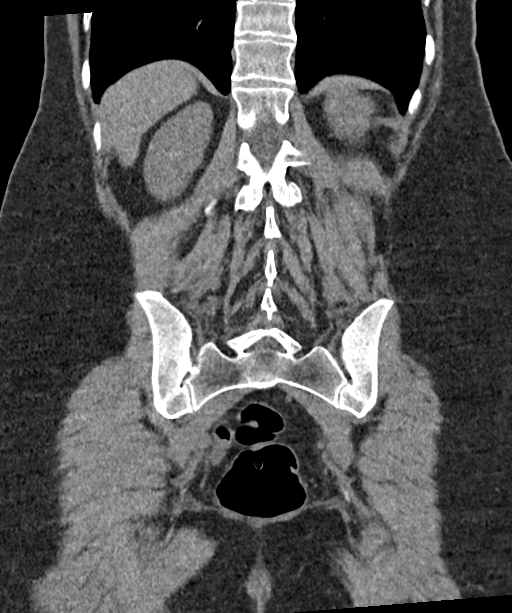

[Series 17: prone colon 1.50 br40 s3 prone thin · axial · 0.75mm/px · z∈[+1425,+1767]mm · 9 of 286 slices shown, 15 images]
[im 29/286  soft-tissue]
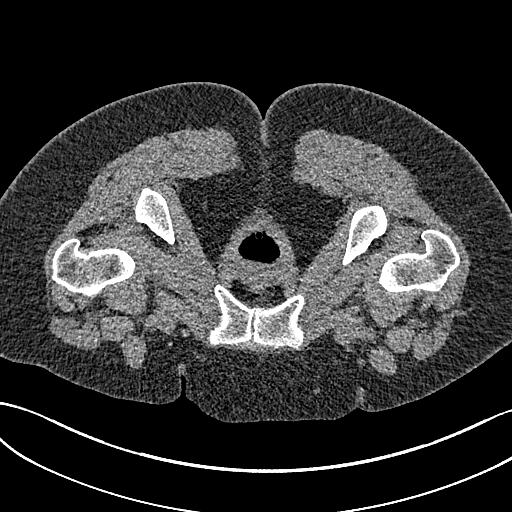
[im 29/286  bone]
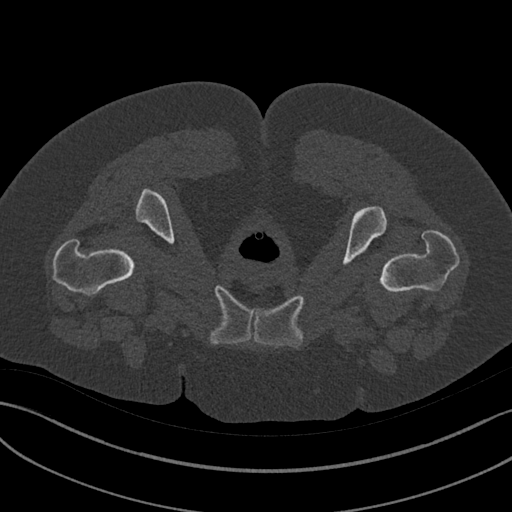
[im 58/286  soft-tissue]
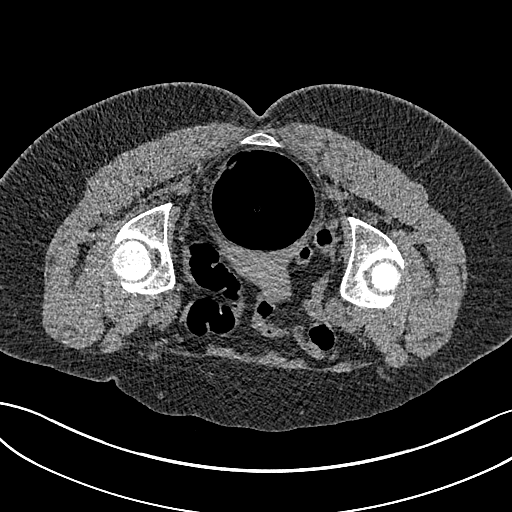
[im 86/286  soft-tissue]
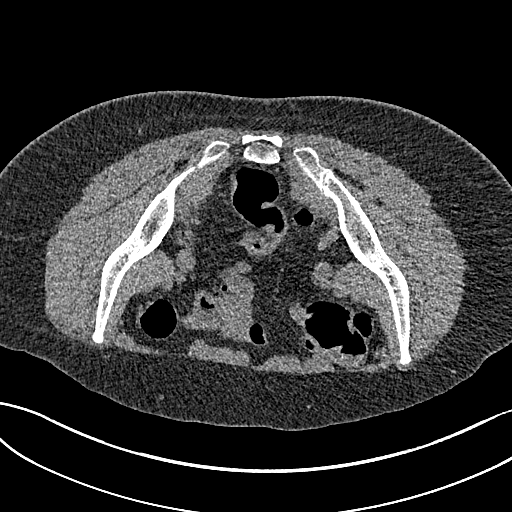
[im 115/286  soft-tissue]
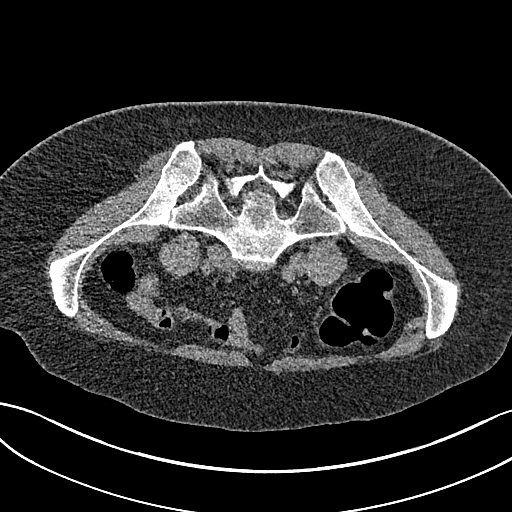
[im 143/286  soft-tissue]
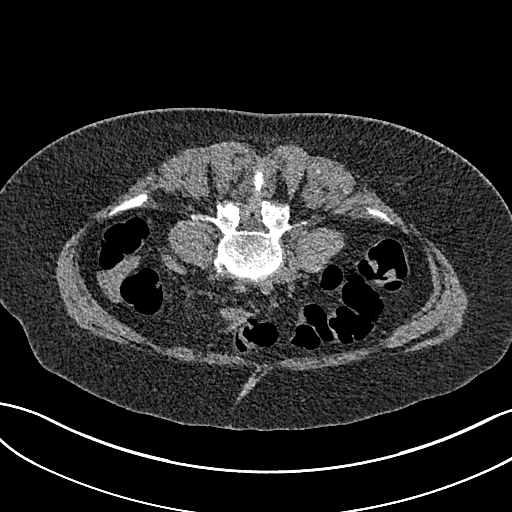
[im 172/286  soft-tissue]
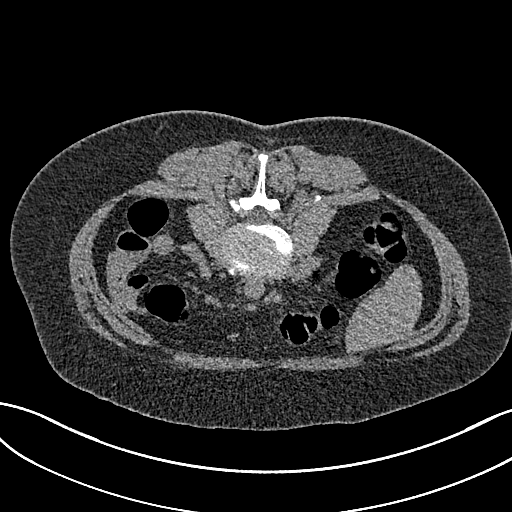
[im 172/286  lung]
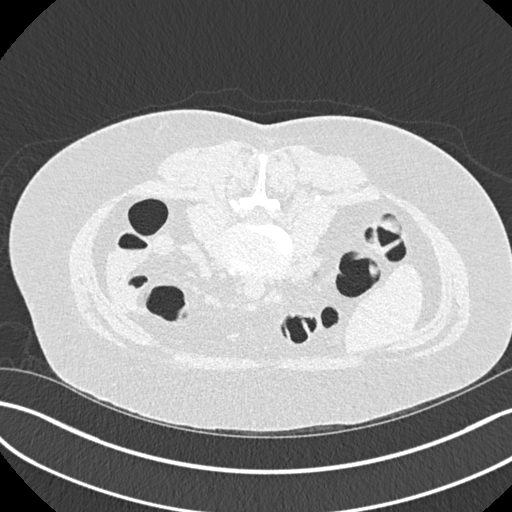
[im 200/286  soft-tissue]
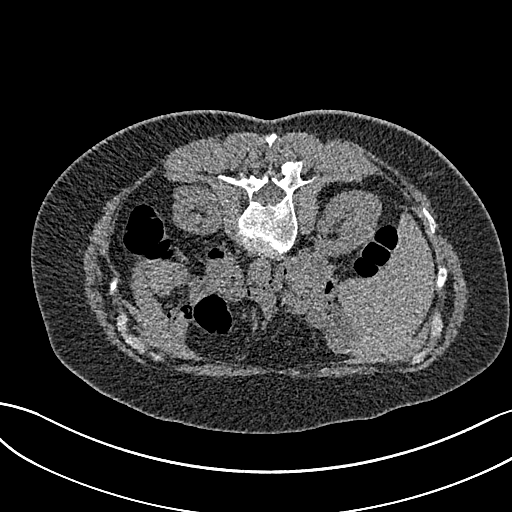
[im 200/286  lung]
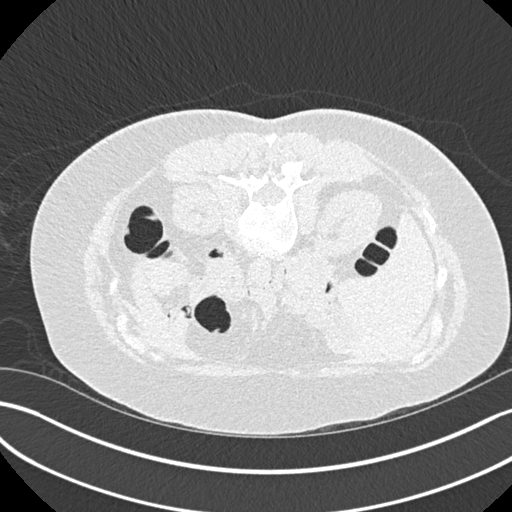
[im 229/286  soft-tissue]
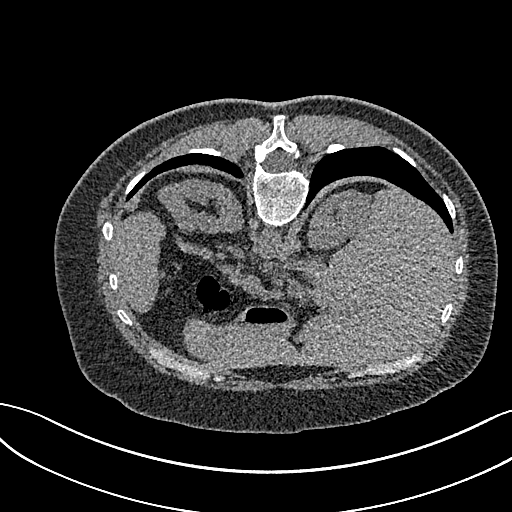
[im 229/286  lung]
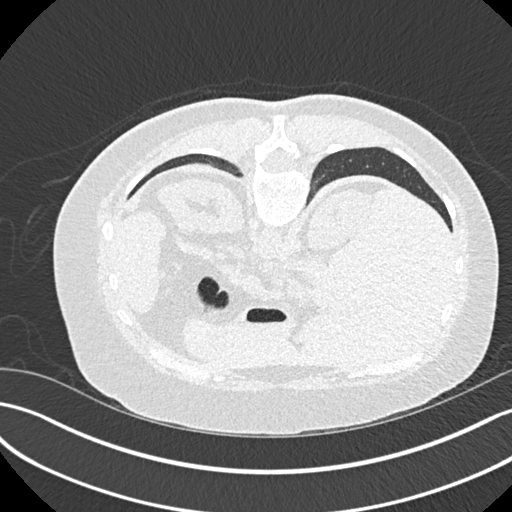
[im 257/286  soft-tissue]
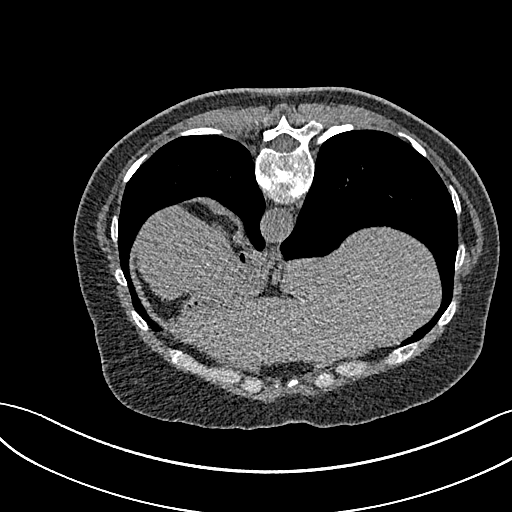
[im 257/286  lung]
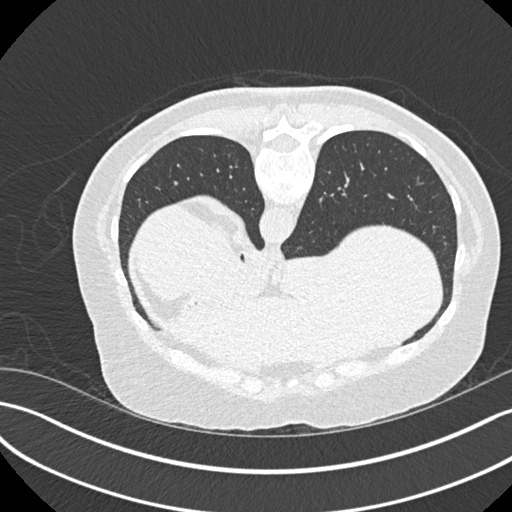
[im 257/286  bone]
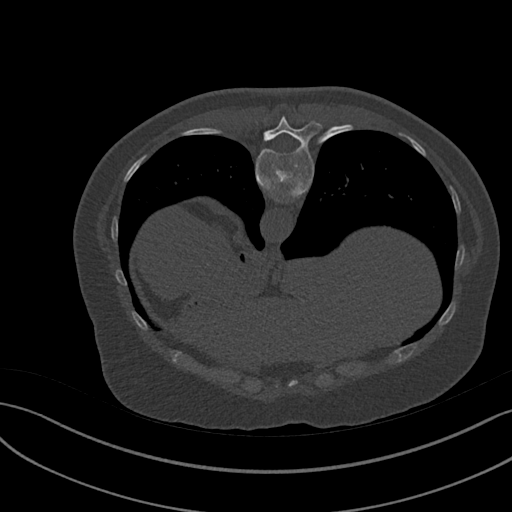

[12 of 46 positions shown; findings below may reference images not displayed]

FINDINGS: VIRTUAL COLONOSCOPY

Evaluation is somewhat limited in the sigmoid colon due to
suboptimal distension. Otherwise, no polyp, mass or stricture.

Virtual colonoscopy is not designed to detect diminutive polyps
(i.e., less than or equal to 5 mm), the presence or absence of which
may not affect clinical management.

CT ABDOMEN AND PELVIS WITHOUT CONTRAST

Lower chest: Lung bases are clear. Heart size normal. No pericardial
or pleural effusion. Distal esophagus is grossly unremarkable.

Hepatobiliary: Liver and gallbladder are unremarkable. No biliary
ductal dilatation.

Pancreas: Negative.

Spleen: Negative.

Adrenals/Urinary Tract: Right adrenal gland is unremarkable. 1.2 cm
fluid density left adrenal nodule. Punctate stone in the left
kidney. Kidneys are otherwise unremarkable. Ureters are
decompressed. Bladder is low in volume.

Stomach/Bowel: Stomach and small bowel are unremarkable. Colon is
discussed in the virtual colonoscopy section of the report.

Vascular/Lymphatic: Vascular structures are unremarkable. No
pathologically enlarged lymph nodes.

Reproductive: Uterus is visualized.  No adnexal mass.

Other: No free fluid.  Mesenteries and peritoneum are unremarkable.

Musculoskeletal: No worrisome lytic or sclerotic lesions.
IMPRESSION: 1. Sigmoid colon is somewhat suboptimally evaluated due to
incomplete distension. No definite polyp, mass or stricture.
2. Left adrenal adenoma.
3. Punctate left renal stone.

## 2019-09-11 DIAGNOSIS — B0239 Other herpes zoster eye disease: Secondary | ICD-10-CM | POA: Diagnosis not present

## 2019-09-13 DIAGNOSIS — F250 Schizoaffective disorder, bipolar type: Secondary | ICD-10-CM | POA: Diagnosis not present

## 2019-10-04 DIAGNOSIS — H40023 Open angle with borderline findings, high risk, bilateral: Secondary | ICD-10-CM | POA: Diagnosis not present

## 2019-11-19 DIAGNOSIS — B351 Tinea unguium: Secondary | ICD-10-CM | POA: Diagnosis not present

## 2019-11-19 DIAGNOSIS — D229 Melanocytic nevi, unspecified: Secondary | ICD-10-CM | POA: Diagnosis not present

## 2019-11-19 DIAGNOSIS — L918 Other hypertrophic disorders of the skin: Secondary | ICD-10-CM | POA: Diagnosis not present

## 2019-11-19 DIAGNOSIS — L821 Other seborrheic keratosis: Secondary | ICD-10-CM | POA: Diagnosis not present

## 2019-11-19 DIAGNOSIS — L814 Other melanin hyperpigmentation: Secondary | ICD-10-CM | POA: Diagnosis not present

## 2019-11-19 DIAGNOSIS — D1801 Hemangioma of skin and subcutaneous tissue: Secondary | ICD-10-CM | POA: Diagnosis not present

## 2019-11-21 DIAGNOSIS — H0012 Chalazion right lower eyelid: Secondary | ICD-10-CM | POA: Diagnosis not present

## 2019-11-21 DIAGNOSIS — H0100A Unspecified blepharitis right eye, upper and lower eyelids: Secondary | ICD-10-CM | POA: Diagnosis not present

## 2019-11-21 DIAGNOSIS — H5711 Ocular pain, right eye: Secondary | ICD-10-CM | POA: Diagnosis not present

## 2019-12-31 ENCOUNTER — Encounter (HOSPITAL_BASED_OUTPATIENT_CLINIC_OR_DEPARTMENT_OTHER): Payer: Self-pay

## 2019-12-31 ENCOUNTER — Emergency Department (HOSPITAL_BASED_OUTPATIENT_CLINIC_OR_DEPARTMENT_OTHER)
Admission: EM | Admit: 2019-12-31 | Discharge: 2019-12-31 | Disposition: A | Discharge status: Home / Self Care | Payer: Medicare Other | Attending: Emergency Medicine | Admitting: Emergency Medicine

## 2019-12-31 ENCOUNTER — Other Ambulatory Visit: Payer: Self-pay

## 2019-12-31 DIAGNOSIS — Y92481 Parking lot as the place of occurrence of the external cause: Secondary | ICD-10-CM | POA: Insufficient documentation

## 2019-12-31 DIAGNOSIS — S00531A Contusion of lip, initial encounter: Secondary | ICD-10-CM | POA: Diagnosis not present

## 2019-12-31 DIAGNOSIS — W228XXA Striking against or struck by other objects, initial encounter: Secondary | ICD-10-CM | POA: Insufficient documentation

## 2019-12-31 DIAGNOSIS — Z79899 Other long term (current) drug therapy: Secondary | ICD-10-CM | POA: Diagnosis not present

## 2019-12-31 DIAGNOSIS — Y999 Unspecified external cause status: Secondary | ICD-10-CM | POA: Diagnosis not present

## 2019-12-31 DIAGNOSIS — Y9389 Activity, other specified: Secondary | ICD-10-CM | POA: Insufficient documentation
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
## 2020-01-03 DIAGNOSIS — I10 Essential (primary) hypertension: Secondary | ICD-10-CM | POA: Diagnosis not present

## 2020-01-03 DIAGNOSIS — R399 Unspecified symptoms and signs involving the genitourinary system: Secondary | ICD-10-CM | POA: Diagnosis not present

## 2020-01-03 DIAGNOSIS — E785 Hyperlipidemia, unspecified: Secondary | ICD-10-CM | POA: Diagnosis not present

## 2020-01-03 DIAGNOSIS — R0602 Shortness of breath: Secondary | ICD-10-CM | POA: Diagnosis not present

## 2020-01-09 DIAGNOSIS — Z124 Encounter for screening for malignant neoplasm of cervix: Secondary | ICD-10-CM | POA: Diagnosis not present

## 2020-01-09 DIAGNOSIS — Z1231 Encounter for screening mammogram for malignant neoplasm of breast: Secondary | ICD-10-CM | POA: Diagnosis not present

## 2020-01-21 DIAGNOSIS — Z23 Encounter for immunization: Secondary | ICD-10-CM | POA: Diagnosis not present

## 2020-02-13 DIAGNOSIS — Z0000 Encounter for general adult medical examination without abnormal findings: Secondary | ICD-10-CM | POA: Diagnosis not present

## 2020-04-09 DIAGNOSIS — H40023 Open angle with borderline findings, high risk, bilateral: Secondary | ICD-10-CM | POA: Diagnosis not present

## 2020-05-02 DIAGNOSIS — B351 Tinea unguium: Secondary | ICD-10-CM | POA: Diagnosis not present

## 2020-05-20 DIAGNOSIS — L608 Other nail disorders: Secondary | ICD-10-CM | POA: Diagnosis not present

## 2020-05-27 ENCOUNTER — Ambulatory Visit: Payer: Medicare Other | Admitting: Podiatry

## 2020-06-03 ENCOUNTER — Ambulatory Visit: Payer: Medicare Other

## 2020-06-03 ENCOUNTER — Other Ambulatory Visit: Payer: Self-pay

## 2020-06-03 ENCOUNTER — Ambulatory Visit (INDEPENDENT_AMBULATORY_CARE_PROVIDER_SITE_OTHER): Payer: Medicare Other | Admitting: Podiatry

## 2020-06-03 DIAGNOSIS — M2042 Other hammer toe(s) (acquired), left foot: Secondary | ICD-10-CM | POA: Diagnosis not present

## 2020-06-03 DIAGNOSIS — L603 Nail dystrophy: Secondary | ICD-10-CM

## 2020-06-05 ENCOUNTER — Encounter: Payer: Self-pay | Admitting: Podiatry

## 2020-06-05 NOTE — Progress Notes (Signed)
  Subjective:  Patient ID: Misty Campos, female    DOB: [birth year],  MRN: [id number]  Chief Complaint  Patient presents with  . Nail Problem       (npL great and 2nd toe nail deformity    60 y.o. female presents with the above complaint. History confirmed with patient.  Does not know if she damage at one point she is concerned about fungus is very thick and feels like it gets ingrown  Objective:  Physical Exam: warm, good capillary refill, no trophic changes or ulcerative lesions, normal DP and PT pulses and normal sensory exam. Left Foot: Hallux nail thick with a transverse Beau's line, appears to have had previous nail damage with new nail growing which appears normal with clearing, dystrophic thick second toe with reducible hammertoe deformity Assessment:   1. Hammertoe of left foot   2. Nail dystrophy      Plan:  Patient was evaluated and treated and all questions answered.  Review the etiology of the nail dystrophy from previous trauma and the hammertoe that she has not second further contributing to it.  I reduced and filed the thick parts of the nail that were uncomfortable for her.  She is return as needed advised her she can get pedicures to alleviate this otherwise.  Return if symptoms worsen or fail to improve.

## 2020-06-23 DIAGNOSIS — Z23 Encounter for immunization: Secondary | ICD-10-CM | POA: Diagnosis not present

## 2020-07-29 DIAGNOSIS — J069 Acute upper respiratory infection, unspecified: Secondary | ICD-10-CM | POA: Diagnosis not present

## 2020-07-29 DIAGNOSIS — U071 COVID-19: Secondary | ICD-10-CM | POA: Diagnosis not present

## 2020-08-25 DIAGNOSIS — Z20822 Contact with and (suspected) exposure to covid-19: Secondary | ICD-10-CM | POA: Diagnosis not present

## 2020-10-02 DIAGNOSIS — D485 Neoplasm of uncertain behavior of skin: Secondary | ICD-10-CM | POA: Diagnosis not present

## 2020-10-02 DIAGNOSIS — L820 Inflamed seborrheic keratosis: Secondary | ICD-10-CM | POA: Diagnosis not present

## 2020-10-07 DIAGNOSIS — H401231 Low-tension glaucoma, bilateral, mild stage: Secondary | ICD-10-CM | POA: Diagnosis not present

## 2020-11-11 DIAGNOSIS — H401232 Low-tension glaucoma, bilateral, moderate stage: Secondary | ICD-10-CM | POA: Diagnosis not present

## 2020-11-18 DIAGNOSIS — L905 Scar conditions and fibrosis of skin: Secondary | ICD-10-CM | POA: Diagnosis not present

## 2020-11-18 DIAGNOSIS — D225 Melanocytic nevi of trunk: Secondary | ICD-10-CM | POA: Diagnosis not present

## 2020-11-18 DIAGNOSIS — L820 Inflamed seborrheic keratosis: Secondary | ICD-10-CM | POA: Diagnosis not present

## 2020-11-18 DIAGNOSIS — L814 Other melanin hyperpigmentation: Secondary | ICD-10-CM | POA: Diagnosis not present

## 2020-11-18 DIAGNOSIS — L821 Other seborrheic keratosis: Secondary | ICD-10-CM | POA: Diagnosis not present

## 2020-11-21 DIAGNOSIS — Z23 Encounter for immunization: Secondary | ICD-10-CM | POA: Diagnosis not present

## 2020-12-09 DIAGNOSIS — D492 Neoplasm of unspecified behavior of bone, soft tissue, and skin: Secondary | ICD-10-CM | POA: Diagnosis not present

## 2020-12-09 DIAGNOSIS — L538 Other specified erythematous conditions: Secondary | ICD-10-CM | POA: Diagnosis not present

## 2020-12-09 DIAGNOSIS — R208 Other disturbances of skin sensation: Secondary | ICD-10-CM | POA: Diagnosis not present

## 2020-12-09 DIAGNOSIS — R58 Hemorrhage, not elsewhere classified: Secondary | ICD-10-CM | POA: Diagnosis not present

## 2020-12-09 DIAGNOSIS — L821 Other seborrheic keratosis: Secondary | ICD-10-CM | POA: Diagnosis not present

## 2020-12-09 DIAGNOSIS — D485 Neoplasm of uncertain behavior of skin: Secondary | ICD-10-CM | POA: Diagnosis not present

## 2020-12-31 DIAGNOSIS — Z20828 Contact with and (suspected) exposure to other viral communicable diseases: Secondary | ICD-10-CM | POA: Diagnosis not present

## 2021-01-22 DIAGNOSIS — Z113 Encounter for screening for infections with a predominantly sexual mode of transmission: Secondary | ICD-10-CM | POA: Diagnosis not present

## 2021-01-22 DIAGNOSIS — Z6830 Body mass index (BMI) 30.0-30.9, adult: Secondary | ICD-10-CM | POA: Diagnosis not present

## 2021-01-22 DIAGNOSIS — Z1231 Encounter for screening mammogram for malignant neoplasm of breast: Secondary | ICD-10-CM | POA: Diagnosis not present

## 2021-01-22 DIAGNOSIS — Z124 Encounter for screening for malignant neoplasm of cervix: Secondary | ICD-10-CM | POA: Diagnosis not present

## 2021-01-22 DIAGNOSIS — Z01411 Encounter for gynecological examination (general) (routine) with abnormal findings: Secondary | ICD-10-CM | POA: Diagnosis not present

## 2021-01-22 DIAGNOSIS — Z01419 Encounter for gynecological examination (general) (routine) without abnormal findings: Secondary | ICD-10-CM | POA: Diagnosis not present

## 2021-03-03 DIAGNOSIS — B0229 Other postherpetic nervous system involvement: Secondary | ICD-10-CM | POA: Diagnosis not present

## 2021-03-03 DIAGNOSIS — F3181 Bipolar II disorder: Secondary | ICD-10-CM | POA: Diagnosis not present

## 2021-03-03 DIAGNOSIS — Z0000 Encounter for general adult medical examination without abnormal findings: Secondary | ICD-10-CM | POA: Diagnosis not present

## 2021-03-03 DIAGNOSIS — E785 Hyperlipidemia, unspecified: Secondary | ICD-10-CM | POA: Diagnosis not present

## 2021-03-03 DIAGNOSIS — Z79899 Other long term (current) drug therapy: Secondary | ICD-10-CM | POA: Diagnosis not present

## 2021-03-03 DIAGNOSIS — Z1211 Encounter for screening for malignant neoplasm of colon: Secondary | ICD-10-CM | POA: Diagnosis not present

## 2021-03-10 DIAGNOSIS — Z1211 Encounter for screening for malignant neoplasm of colon: Secondary | ICD-10-CM | POA: Diagnosis not present

## 2021-03-16 ENCOUNTER — Telehealth: Payer: Self-pay | Admitting: Internal Medicine

## 2021-03-16 NOTE — Telephone Encounter (Signed)
Patient called and stated that she needed a refill Linzess. Please advise.

## 2021-03-17 NOTE — Telephone Encounter (Signed)
Thank you.   Patient was scheduled with Amy on 04/09/2021   2:00.

## 2021-03-17 NOTE — Telephone Encounter (Signed)
Unfortunately, we have not seen this patient in 4 years and cannot give her medications until she is seen in the office again.

## 2021-03-30 DIAGNOSIS — Z20822 Contact with and (suspected) exposure to covid-19: Secondary | ICD-10-CM | POA: Diagnosis not present

## 2021-04-09 ENCOUNTER — Encounter: Payer: Self-pay | Admitting: Physician Assistant

## 2021-04-09 ENCOUNTER — Ambulatory Visit (INDEPENDENT_AMBULATORY_CARE_PROVIDER_SITE_OTHER): Payer: Medicare Other | Admitting: Physician Assistant

## 2021-04-09 VITALS — BP 110/62 | HR 87 | Ht 67.0 in | Wt 193.0 lb

## 2021-04-09 DIAGNOSIS — K5909 Other constipation: Secondary | ICD-10-CM | POA: Diagnosis not present

## 2021-04-09 DIAGNOSIS — K589 Irritable bowel syndrome without diarrhea: Secondary | ICD-10-CM | POA: Diagnosis not present

## 2021-04-09 MED ORDER — LINACLOTIDE 72 MCG PO CAPS: 72.0000 ug | ORAL_CAPSULE | Freq: Every day | ORAL | 11 refills | Status: DC

## 2021-04-09 NOTE — Progress Notes (Signed)
Subjective:    Patient ID: Misty Campos, female    DOB: 08/29/1960, 61 y.o.   MRN: [id number]  HPI Misty Campos is a 61 year old female, established with Dr. Hilarie Fredrickson and also known to myself.  She was last seen here in fall 2019, and comes in today for follow-up.  She has history of chronic constipation and abdominal bloating.  She was started on Linzess 72 mcg daily in 2019 and says that continues to work well but she wonders if there is something that may be better.  She says with the Linzess she generally does have a bowel movement every day, stools will intermittently be loose and she would like to consistently have a formed stool.  She is having less issues with bloating as she is gone off of some of her previous psychiatric medications.  Not currently having any ongoing problems with abdominal pain, overall no changes in her bowel habits has had long-term intermittent loose stools.  She is concerned and asks questions about different colors of stools etc. She did undergo colon cancer screening with virtual colonoscopy in February 2020.  This was a negative study, mention that sigmoid colon was somewhat suboptimally evaluated due to incomplete distention. Patient relates that she had Hemoccult testing done through her PCP which was negative recently. We reviewed today, mother does not have history of colon cancer, she did have a maternal grandmother with history of colon cancer. Patient very talkative and somewhat tangential historian.  Review of Systems Pertinent positive and negative review of systems were noted in the above HPI section.  All other review of systems was otherwise negative.   Outpatient Encounter Medications as of 04/09/2021  Medication Sig   acyclovir (ZOVIRAX) 400 MG tablet Take 400 mg by mouth 2 (two) times daily.   gabapentin (NEURONTIN) 100 MG capsule Take 1 capsule by mouth 2 (two) times daily.   naproxen (NAPROSYN) 500 MG tablet naproxen 500 mg tablet    senna-docusate (SENOKOT-S) 8.6-50 MG tablet Stool Softener-Laxative 8.6 mg-50 mg tablet  2 TABLETS AT BEDTIME ORALLY 30 DAY(S)   [DISCONTINUED] benztropine (COGENTIN) 1 MG tablet Take 1 tablet by mouth daily.   [DISCONTINUED] clonazePAM (KLONOPIN) 0.5 MG tablet Take 1 tablet by mouth 3 (three) times daily as needed.   [DISCONTINUED] linaclotide (LINZESS) 72 MCG capsule Take 1 capsule (72 mcg total) by mouth daily before breakfast.   [DISCONTINUED] loxapine (LOXITANE) 5 MG capsule 1 tab a day   linaclotide (LINZESS) 72 MCG capsule Take 1 capsule (72 mcg total) by mouth daily before breakfast.   No facility-administered encounter medications on file as of 04/09/2021.   No Known Allergies There are no problems to display for this patient.  Social History   Socioeconomic History   Marital status: Single    Spouse name: Not on file   Number of children: Not on file   Years of education: Not on file   Highest education level: Not on file  Occupational History   Not on file  Tobacco Use   Smoking status: Never   Smokeless tobacco: Never  Vaping Use   Vaping Use: Never used  Substance and Sexual Activity   Alcohol use: Yes    Comment: occ   Drug use: Not Currently   Sexual activity: Not on file  Other Topics Concern   Not on file  Social History Narrative   Not on file   Social Determinants of Health   Financial Resource Strain: Not on file  Food Insecurity: Not  on file  Transportation Needs: Not on file  Physical Activity: Not on file  Stress: Not on file  Social Connections: Not on file  Intimate Partner Violence: Not on file    Misty Campos's family history includes Colon cancer in her maternal grandfather and maternal grandmother; Dementia in her father; Other in her mother; Pancreatic cancer in her maternal grandfather.      Objective:    Vitals:   04/09/21 1359  BP: 110/62  Pulse: 87    Physical Exam Well-developed well-nourished older white female in no  acute distress.   Weight, 193 BMI 30.23  HEENT; nontraumatic normocephalic, EOMI, PE R LA, sclera anicteric. Oropharynx; not examined today Neck; supple, no JVD Cardiovascular; regular rate and rhythm with S1-S2, no murmur rub or gallop Pulmonary; Clear bilaterally Abdomen; soft, obese ,nontender, nondistended, no palpable mass or hepatosplenomegaly, bowel sounds are active Rectal; not done today Skin; benign exam, no jaundice rash or appreciable lesions Extremities; no clubbing cyanosis or edema skin warm and dry Neuro/Psych; alert and oriented x4, grossly nonfocal rapid somewhat pressured speech       Assessment & Plan:   #63 61 year old female with chronic constipation, and component of IBS with intermittent loose stools which has been a long-term issue. Doing well on Linzess 22 mcg daily,, sometimes not having formed stools  #2 colon cancer screening-negative virtual colonoscopy February 2020 #3 bipolar disorder-patient has been off medications  Plan; refill Linzess 72 mcg daily, x1 year We discussed adding a fiber supplement in the form of Benefiber 1 scoop daily in a glass of water to help bulk stools, and hopefully this will help her have more consistently formed stools We discussed potential screening colonoscopy at 5-year interval/February 2025. We will plan office follow-up in 1 year, or sooner as needed if she has any problems in the interim  Alfredia Ferguson PA-C 04/09/2021   Cc: Maurice Small, MD

## 2021-04-09 NOTE — Patient Instructions (Addendum)
If you are age 60 or younger, your body mass index should be between 19-25. Your Body mass index is 30.23 kg/m. If this is out of the aformentioned range listed, please consider follow up with your Primary Care Provider.  ________________________________________________________  The [facility] providers would like to encourage you to use [facility] to communicate with providers for non-urgent requests or questions.  Due to long hold times on the telephone, sending your provider a message by [facility] may be a faster and more efficient way to get a response.  Please allow 48 business hours for a response.  Please remember that this is for non-urgent requests.  _______________________________________________________  Continue Linzess 72 mcg. We have sent refills to your pharmacy  Use Benefiber 1 scoop daily in a glass of water every day.  Follow up in 1 year with with Dr. Hilarie Fredrickson.  Thank you for entrusting me with your care and choosing [facility].  Lehman Prom, PA-C

## 2021-04-10 NOTE — Progress Notes (Signed)
Addendum: Reviewed and agree with assessment and management plan. Anyssa Sharpless M, MD  

## 2021-04-24 DIAGNOSIS — U071 COVID-19: Secondary | ICD-10-CM | POA: Diagnosis not present

## 2021-04-24 DIAGNOSIS — R059 Cough, unspecified: Secondary | ICD-10-CM | POA: Diagnosis not present

## 2021-04-24 DIAGNOSIS — J029 Acute pharyngitis, unspecified: Secondary | ICD-10-CM | POA: Diagnosis not present

## 2021-04-24 DIAGNOSIS — R0981 Nasal congestion: Secondary | ICD-10-CM | POA: Diagnosis not present

## 2021-04-24 DIAGNOSIS — R112 Nausea with vomiting, unspecified: Secondary | ICD-10-CM | POA: Diagnosis not present

## 2021-04-24 DIAGNOSIS — H8112 Benign paroxysmal vertigo, left ear: Secondary | ICD-10-CM | POA: Diagnosis not present

## 2021-04-29 DIAGNOSIS — Z20822 Contact with and (suspected) exposure to covid-19: Secondary | ICD-10-CM | POA: Diagnosis not present

## 2021-05-04 ENCOUNTER — Other Ambulatory Visit: Payer: Self-pay

## 2021-05-04 ENCOUNTER — Ambulatory Visit (INDEPENDENT_AMBULATORY_CARE_PROVIDER_SITE_OTHER): Payer: Medicare Other | Admitting: Podiatry

## 2021-05-04 DIAGNOSIS — L603 Nail dystrophy: Secondary | ICD-10-CM | POA: Diagnosis not present

## 2021-05-04 DIAGNOSIS — M2042 Other hammer toe(s) (acquired), left foot: Secondary | ICD-10-CM

## 2021-05-04 NOTE — Patient Instructions (Signed)
More silicone toe caps can be purchased from: ? ?[email or web address] ? ? ?A nail nipper can be bought on [employer]  ?

## 2021-05-10 ENCOUNTER — Encounter: Payer: Self-pay | Admitting: Podiatry

## 2021-05-10 NOTE — Progress Notes (Signed)
?  Subjective:  ?Patient ID: Misty Campos, female    DOB: Mar 16, 1960,  MRN: [id number] ? ?Chief Complaint  ?Patient presents with  ? Nail Problem  ?     (npL great and 2nd toe nail deformity  ? ? ?61 y.o. female presents with the above complaint. History confirmed with patient.  Has improved some but still concerned about second toenail ? ?Objective:  ?Physical Exam: ?warm, good capillary refill, no trophic changes or ulcerative lesions, normal DP and PT pulses and normal sensory exam. ?Left Foot: Dystrophic thick second toenail with reducible hammertoe deformity ?Assessment:  ? ?1. Nail dystrophy   ?2. Hammertoe of left foot   ? ? ? ?Plan:  ?Patient was evaluated and treated and all questions answered. ? ?Today we again reviewed the etiology of the nail dystrophy from previous trauma and the hammertoe that she has not second further contributing to it.  I reduced and filed the thick parts of the nail that were uncomfortable for her as a courtesy.  She is return as needed advised her she can get pedicures to alleviate this otherwise. ? ?Return if symptoms worsen or fail to improve.  ? ?

## 2021-05-12 DIAGNOSIS — E785 Hyperlipidemia, unspecified: Secondary | ICD-10-CM | POA: Diagnosis not present

## 2021-05-13 DIAGNOSIS — H18513 Endothelial corneal dystrophy, bilateral: Secondary | ICD-10-CM | POA: Diagnosis not present

## 2021-05-13 DIAGNOSIS — H401232 Low-tension glaucoma, bilateral, moderate stage: Secondary | ICD-10-CM | POA: Diagnosis not present

## 2021-05-29 DIAGNOSIS — Z20828 Contact with and (suspected) exposure to other viral communicable diseases: Secondary | ICD-10-CM | POA: Diagnosis not present

## 2021-05-29 DIAGNOSIS — Z1152 Encounter for screening for COVID-19: Secondary | ICD-10-CM | POA: Diagnosis not present

## 2021-06-08 DIAGNOSIS — Z20822 Contact with and (suspected) exposure to covid-19: Secondary | ICD-10-CM | POA: Diagnosis not present

## 2021-06-09 DIAGNOSIS — D225 Melanocytic nevi of trunk: Secondary | ICD-10-CM | POA: Diagnosis not present

## 2021-06-09 DIAGNOSIS — L84 Corns and callosities: Secondary | ICD-10-CM | POA: Diagnosis not present

## 2021-06-09 DIAGNOSIS — L814 Other melanin hyperpigmentation: Secondary | ICD-10-CM | POA: Diagnosis not present

## 2021-06-09 DIAGNOSIS — L821 Other seborrheic keratosis: Secondary | ICD-10-CM | POA: Diagnosis not present

## 2021-06-22 DIAGNOSIS — R0981 Nasal congestion: Secondary | ICD-10-CM | POA: Diagnosis not present

## 2021-06-22 DIAGNOSIS — Z03818 Encounter for observation for suspected exposure to other biological agents ruled out: Secondary | ICD-10-CM | POA: Diagnosis not present

## 2021-06-22 DIAGNOSIS — J029 Acute pharyngitis, unspecified: Secondary | ICD-10-CM | POA: Diagnosis not present

## 2021-06-22 DIAGNOSIS — R051 Acute cough: Secondary | ICD-10-CM | POA: Diagnosis not present

## 2021-06-22 DIAGNOSIS — B349 Viral infection, unspecified: Secondary | ICD-10-CM | POA: Diagnosis not present

## 2021-11-09 DIAGNOSIS — H401132 Primary open-angle glaucoma, bilateral, moderate stage: Secondary | ICD-10-CM | POA: Diagnosis not present

## 2021-11-13 DIAGNOSIS — E785 Hyperlipidemia, unspecified: Secondary | ICD-10-CM | POA: Diagnosis not present

## 2021-11-13 DIAGNOSIS — M79604 Pain in right leg: Secondary | ICD-10-CM | POA: Diagnosis not present

## 2021-11-13 DIAGNOSIS — M79605 Pain in left leg: Secondary | ICD-10-CM | POA: Diagnosis not present

## 2021-11-13 DIAGNOSIS — G629 Polyneuropathy, unspecified: Secondary | ICD-10-CM | POA: Diagnosis not present

## 2021-11-13 DIAGNOSIS — H409 Unspecified glaucoma: Secondary | ICD-10-CM | POA: Diagnosis not present

## 2021-11-20 DIAGNOSIS — Z23 Encounter for immunization: Secondary | ICD-10-CM | POA: Diagnosis not present

## 2021-12-09 DIAGNOSIS — M79652 Pain in left thigh: Secondary | ICD-10-CM | POA: Diagnosis not present

## 2021-12-09 DIAGNOSIS — M79651 Pain in right thigh: Secondary | ICD-10-CM | POA: Diagnosis not present

## 2021-12-09 DIAGNOSIS — M25551 Pain in right hip: Secondary | ICD-10-CM | POA: Diagnosis not present

## 2021-12-09 DIAGNOSIS — M25552 Pain in left hip: Secondary | ICD-10-CM | POA: Diagnosis not present

## 2021-12-14 DIAGNOSIS — H401233 Low-tension glaucoma, bilateral, severe stage: Secondary | ICD-10-CM | POA: Diagnosis not present

## 2021-12-24 DIAGNOSIS — M79652 Pain in left thigh: Secondary | ICD-10-CM | POA: Diagnosis not present

## 2021-12-24 DIAGNOSIS — M79651 Pain in right thigh: Secondary | ICD-10-CM | POA: Diagnosis not present

## 2021-12-30 DIAGNOSIS — M79652 Pain in left thigh: Secondary | ICD-10-CM | POA: Diagnosis not present

## 2021-12-30 DIAGNOSIS — M79651 Pain in right thigh: Secondary | ICD-10-CM | POA: Diagnosis not present

## 2022-01-04 DIAGNOSIS — M79651 Pain in right thigh: Secondary | ICD-10-CM | POA: Diagnosis not present

## 2022-01-04 DIAGNOSIS — M79652 Pain in left thigh: Secondary | ICD-10-CM | POA: Diagnosis not present

## 2022-01-08 DIAGNOSIS — M79652 Pain in left thigh: Secondary | ICD-10-CM | POA: Diagnosis not present

## 2022-01-08 DIAGNOSIS — M79651 Pain in right thigh: Secondary | ICD-10-CM | POA: Diagnosis not present

## 2022-01-12 DIAGNOSIS — M79652 Pain in left thigh: Secondary | ICD-10-CM | POA: Diagnosis not present

## 2022-01-12 DIAGNOSIS — M79651 Pain in right thigh: Secondary | ICD-10-CM | POA: Diagnosis not present

## 2022-01-19 DIAGNOSIS — M79651 Pain in right thigh: Secondary | ICD-10-CM | POA: Diagnosis not present

## 2022-01-19 DIAGNOSIS — M79652 Pain in left thigh: Secondary | ICD-10-CM | POA: Diagnosis not present

## 2022-01-20 DIAGNOSIS — M25552 Pain in left hip: Secondary | ICD-10-CM | POA: Diagnosis not present

## 2022-01-20 DIAGNOSIS — M79652 Pain in left thigh: Secondary | ICD-10-CM | POA: Diagnosis not present

## 2022-01-20 DIAGNOSIS — M79651 Pain in right thigh: Secondary | ICD-10-CM | POA: Diagnosis not present

## 2022-01-20 DIAGNOSIS — M25551 Pain in right hip: Secondary | ICD-10-CM | POA: Diagnosis not present

## 2022-01-21 DIAGNOSIS — M79652 Pain in left thigh: Secondary | ICD-10-CM | POA: Diagnosis not present

## 2022-01-21 DIAGNOSIS — M79651 Pain in right thigh: Secondary | ICD-10-CM | POA: Diagnosis not present

## 2022-01-26 DIAGNOSIS — M79651 Pain in right thigh: Secondary | ICD-10-CM | POA: Diagnosis not present

## 2022-01-26 DIAGNOSIS — M79652 Pain in left thigh: Secondary | ICD-10-CM | POA: Diagnosis not present

## 2022-01-29 DIAGNOSIS — M25551 Pain in right hip: Secondary | ICD-10-CM | POA: Diagnosis not present

## 2022-01-29 DIAGNOSIS — M25552 Pain in left hip: Secondary | ICD-10-CM | POA: Diagnosis not present

## 2022-02-02 DIAGNOSIS — M79651 Pain in right thigh: Secondary | ICD-10-CM | POA: Diagnosis not present

## 2022-02-02 DIAGNOSIS — M79652 Pain in left thigh: Secondary | ICD-10-CM | POA: Diagnosis not present

## 2022-02-03 DIAGNOSIS — Z1231 Encounter for screening mammogram for malignant neoplasm of breast: Secondary | ICD-10-CM | POA: Diagnosis not present

## 2022-02-03 DIAGNOSIS — Z6829 Body mass index (BMI) 29.0-29.9, adult: Secondary | ICD-10-CM | POA: Diagnosis not present

## 2022-02-03 DIAGNOSIS — Z0142 Encounter for cervical smear to confirm findings of recent normal smear following initial abnormal smear: Secondary | ICD-10-CM | POA: Diagnosis not present

## 2022-02-03 DIAGNOSIS — Z01419 Encounter for gynecological examination (general) (routine) without abnormal findings: Secondary | ICD-10-CM | POA: Diagnosis not present

## 2022-02-03 DIAGNOSIS — Z01411 Encounter for gynecological examination (general) (routine) with abnormal findings: Secondary | ICD-10-CM | POA: Diagnosis not present

## 2022-02-03 DIAGNOSIS — N841 Polyp of cervix uteri: Secondary | ICD-10-CM | POA: Diagnosis not present

## 2022-02-03 DIAGNOSIS — Z113 Encounter for screening for infections with a predominantly sexual mode of transmission: Secondary | ICD-10-CM | POA: Diagnosis not present

## 2022-02-03 DIAGNOSIS — Z124 Encounter for screening for malignant neoplasm of cervix: Secondary | ICD-10-CM | POA: Diagnosis not present

## 2022-02-03 DIAGNOSIS — Z1151 Encounter for screening for human papillomavirus (HPV): Secondary | ICD-10-CM | POA: Diagnosis not present

## 2022-02-04 DIAGNOSIS — M79652 Pain in left thigh: Secondary | ICD-10-CM | POA: Diagnosis not present

## 2022-02-04 DIAGNOSIS — M79651 Pain in right thigh: Secondary | ICD-10-CM | POA: Diagnosis not present

## 2022-02-08 DIAGNOSIS — M25551 Pain in right hip: Secondary | ICD-10-CM | POA: Diagnosis not present

## 2022-02-08 DIAGNOSIS — M25552 Pain in left hip: Secondary | ICD-10-CM | POA: Diagnosis not present

## 2022-02-12 DIAGNOSIS — M79651 Pain in right thigh: Secondary | ICD-10-CM | POA: Diagnosis not present

## 2022-02-12 DIAGNOSIS — M79652 Pain in left thigh: Secondary | ICD-10-CM | POA: Diagnosis not present

## 2022-02-16 DIAGNOSIS — M79651 Pain in right thigh: Secondary | ICD-10-CM | POA: Diagnosis not present

## 2022-02-16 DIAGNOSIS — M79652 Pain in left thigh: Secondary | ICD-10-CM | POA: Diagnosis not present

## 2022-02-19 DIAGNOSIS — M79652 Pain in left thigh: Secondary | ICD-10-CM | POA: Diagnosis not present

## 2022-02-19 DIAGNOSIS — M79651 Pain in right thigh: Secondary | ICD-10-CM | POA: Diagnosis not present

## 2022-02-24 DIAGNOSIS — M79652 Pain in left thigh: Secondary | ICD-10-CM | POA: Diagnosis not present

## 2022-02-24 DIAGNOSIS — M79651 Pain in right thigh: Secondary | ICD-10-CM | POA: Diagnosis not present

## 2022-03-01 DIAGNOSIS — M79652 Pain in left thigh: Secondary | ICD-10-CM | POA: Diagnosis not present

## 2022-03-01 DIAGNOSIS — M79651 Pain in right thigh: Secondary | ICD-10-CM | POA: Diagnosis not present

## 2022-03-05 DIAGNOSIS — M25551 Pain in right hip: Secondary | ICD-10-CM | POA: Diagnosis not present

## 2022-03-05 DIAGNOSIS — M25552 Pain in left hip: Secondary | ICD-10-CM | POA: Diagnosis not present

## 2022-03-11 DIAGNOSIS — I10 Essential (primary) hypertension: Secondary | ICD-10-CM | POA: Diagnosis not present

## 2022-03-11 DIAGNOSIS — F3181 Bipolar II disorder: Secondary | ICD-10-CM | POA: Diagnosis not present

## 2022-03-11 DIAGNOSIS — B0229 Other postherpetic nervous system involvement: Secondary | ICD-10-CM | POA: Diagnosis not present

## 2022-03-11 DIAGNOSIS — E785 Hyperlipidemia, unspecified: Secondary | ICD-10-CM | POA: Diagnosis not present

## 2022-03-11 DIAGNOSIS — Z0000 Encounter for general adult medical examination without abnormal findings: Secondary | ICD-10-CM | POA: Diagnosis not present

## 2022-03-11 DIAGNOSIS — Z1211 Encounter for screening for malignant neoplasm of colon: Secondary | ICD-10-CM | POA: Diagnosis not present

## 2022-04-02 DIAGNOSIS — Z1211 Encounter for screening for malignant neoplasm of colon: Secondary | ICD-10-CM | POA: Diagnosis not present

## 2022-04-28 ENCOUNTER — Encounter (HOSPITAL_BASED_OUTPATIENT_CLINIC_OR_DEPARTMENT_OTHER): Payer: Self-pay | Admitting: Pediatrics

## 2022-04-28 ENCOUNTER — Other Ambulatory Visit: Payer: Self-pay

## 2022-04-28 ENCOUNTER — Other Ambulatory Visit (HOSPITAL_BASED_OUTPATIENT_CLINIC_OR_DEPARTMENT_OTHER): Payer: Self-pay

## 2022-04-28 ENCOUNTER — Emergency Department (HOSPITAL_BASED_OUTPATIENT_CLINIC_OR_DEPARTMENT_OTHER): Payer: Medicare Other

## 2022-04-28 ENCOUNTER — Emergency Department (HOSPITAL_BASED_OUTPATIENT_CLINIC_OR_DEPARTMENT_OTHER)
Admission: EM | Admit: 2022-04-28 | Discharge: 2022-04-28 | Disposition: A | Discharge status: Home / Self Care | Payer: Medicare Other | Attending: Emergency Medicine | Admitting: Emergency Medicine

## 2022-04-28 DIAGNOSIS — M25552 Pain in left hip: Secondary | ICD-10-CM | POA: Diagnosis not present

## 2022-04-28 MED ORDER — METHYLPREDNISOLONE 4 MG PO TBPK
ORAL_TABLET | ORAL | 0 refills | Status: AC
  Filled 2022-04-28: qty 21, 6d supply, fill #0

## 2022-04-28 MED ORDER — MECLIZINE HCL 25 MG PO TABS
25.0000 mg | ORAL_TABLET | Freq: Two times a day (BID) | ORAL | 0 refills | Status: AC | PRN
  Filled 2022-04-28: qty 10, 5d supply, fill #0

## 2022-04-28 NOTE — Discharge Instructions (Signed)
Follow-up with your orthopedic doctor about your hip pain.  Take Medrol Dosepak as prescribed for this.  Follow-up with your primary care doctor about your dizziness.  If you have another episode you can consider taking meclizine that was prescribed to you.  I suspect this could be an inner ear problem.  May need referral to ear nose and throat doctor if you continue to have this issue.

## 2022-04-28 NOTE — ED Provider Notes (Signed)
Misty Campos   CSN: [id number] Arrival date & time: 04/28/22  1218     History  Chief Complaint  Patient presents with   Hip Pain   Dizziness    Misty Campos is a 62 y.o. female.  Patient here with left hip pain that started yesterday.  History of the same.  Denies any fall or trauma.  Patient also has dizziness for several months.  None currently.  She is followed with orthopedics for her hip in the past.  Denies any chest pain weakness, numbness, tingling.  Denies any back pain.  The history is provided by the patient.       Home Medications Prior to Admission medications   Medication Sig Start Date End Date Taking? Authorizing Provider  meclizine (ANTIVERT) 25 MG tablet Take 1 tablet (25 mg total) by mouth 2 (two) times daily as needed for up to 10 doses for dizziness. 04/28/22  Yes Mylee Falin, DO  methylPREDNISolone (MEDROL DOSEPAK) 4 MG TBPK tablet Follow package insert 04/28/22  Yes Tranesha Lessner, DO  acyclovir (ZOVIRAX) 400 MG tablet Take 400 mg by mouth 2 (two) times daily. 09/12/17   [provider]  gabapentin (NEURONTIN) 100 MG capsule Take 1 capsule by mouth 2 (two) times daily. 10/11/17   [provider]  linaclotide Rolan Lipa) 72 MCG capsule Take 1 capsule (72 mcg total) by mouth daily before breakfast. 04/09/21   Esterwood, Amy S, PA-C  naproxen (NAPROSYN) 500 MG tablet naproxen 500 mg tablet    [provider]  senna-docusate (SENOKOT-S) 8.6-50 MG tablet Stool Softener-Laxative 8.6 mg-50 mg tablet  2 TABLETS AT BEDTIME ORALLY 30 DAY(S)    [provider]      Allergies    Patient has no known allergies.    Review of Systems   Review of Systems  Physical Exam Updated Vital Signs BP 136/72   Pulse 61   Temp 97.6 F (36.4 C) (Oral)   Resp 17   Ht '5\' 7"'$  (1.702 m)   Wt 84.8 kg   SpO2 97%   BMI 29.29 kg/m  Physical Exam Vitals and nursing Campos  reviewed.  Constitutional:      General: She is not in acute distress.    Appearance: She is well-developed. She is not ill-appearing.  HENT:     Head: Normocephalic and atraumatic.     Nose: Nose normal.     Mouth/Throat:     Mouth: Mucous membranes are moist.  Eyes:     Extraocular Movements: Extraocular movements intact.     Conjunctiva/sclera: Conjunctivae normal.     Pupils: Pupils are equal, round, and reactive to light.  Cardiovascular:     Rate and Rhythm: Normal rate and regular rhythm.     Pulses: Normal pulses.     Heart sounds: Normal heart sounds. No murmur heard. Pulmonary:     Effort: Pulmonary effort is normal. No respiratory distress.     Breath sounds: Normal breath sounds.  Abdominal:     Palpations: Abdomen is soft.     Tenderness: There is no abdominal tenderness.  Musculoskeletal:        General: Tenderness present. No swelling. Normal range of motion.     Cervical back: Normal range of motion and neck supple.  Skin:    General: Skin is warm and dry.     Capillary Refill: Capillary refill takes less than 2 seconds.  Neurological:     General:  No focal deficit present.     Mental Status: She is alert and oriented to person, place, and time.     Cranial Nerves: No cranial nerve deficit.     Sensory: No sensory deficit.     Motor: No weakness.     Coordination: Coordination normal.     Comments: 5+ out of 5 strength throughout, normal sensation, no drift, normal finger-nose-finger, normal speech  Psychiatric:        Mood and Affect: Mood normal.     ED Results / Procedures / Treatments   Labs (all labs ordered are listed, but only abnormal results are displayed) Labs Reviewed - No data to display  EKG None  Radiology DG Hip Unilat With Pelvis 2-3 Views Left  Result Date: 04/28/2022 CLINICAL DATA:  Pain EXAM: DG HIP (WITH OR WITHOUT PELVIS) 2-3V LEFT COMPARISON:  CT 04/03/2018 FINDINGS: No fracture or dislocation. Normal mineralization and  alignment. No significant osseous degenerative change. Regional soft tissues unremarkable. No radiodense foreign body. IMPRESSION: Negative. Electronically Signed   By: Lucrezia Europe M.D.   On: 04/28/2022 13:43    Procedures Procedures    Medications Ordered in ED Medications - No data to display  ED Course/ Medical Decision Making/ A&P                             Medical Decision Making Amount and/or Complexity of Data Reviewed Radiology: ordered.  Risk Prescription drug management.   Meta Hatchet is here mostly for left hip pain.  No trauma history.  No back pain.  No loss of bowel or bladder.  Neurovascular neuromuscular intact on exam.  X-ray shows no fracture or malalignment.  Suspect inflammatory process and will treat with Medrol Dosepak.  She is also has some intermittent dizziness for the last few months.  None currently.  Her neuroexam is normal.  This sounds like likely vertigo.  Will prescribe her meclizine and have her follow-up with her primary care doctor.  She is very well-appearing.  Discharged in good condition.  Understands return precautions.  This chart was dictated using voice recognition software.  Despite best efforts to proofread,  errors can occur which can change the documentation meaning.         Final Clinical Impression(s) / ED Diagnoses Final diagnoses:  Left hip pain    Rx / DC Orders ED Discharge Orders          Ordered    methylPREDNISolone (MEDROL DOSEPAK) 4 MG TBPK tablet        04/28/22 1358    meclizine (ANTIVERT) 25 MG tablet  2 times daily PRN        04/28/22 1358              Darcel Zick, DO 04/28/22 1401

## 2022-04-28 NOTE — ED Triage Notes (Signed)
C/O left hip pain started yesterday; denies any recent fall, ambulatory in triage. States she also had episodes of "spinning sensation" that she wants to get checked, not currently, comes and go in nature,

## 2022-05-10 DIAGNOSIS — B349 Viral infection, unspecified: Secondary | ICD-10-CM | POA: Diagnosis not present

## 2022-05-18 DIAGNOSIS — H6593 Unspecified nonsuppurative otitis media, bilateral: Secondary | ICD-10-CM | POA: Diagnosis not present

## 2022-05-18 DIAGNOSIS — J3489 Other specified disorders of nose and nasal sinuses: Secondary | ICD-10-CM | POA: Diagnosis not present

## 2022-06-01 DIAGNOSIS — M25552 Pain in left hip: Secondary | ICD-10-CM | POA: Diagnosis not present

## 2022-06-01 DIAGNOSIS — M79652 Pain in left thigh: Secondary | ICD-10-CM | POA: Diagnosis not present

## 2022-06-02 DIAGNOSIS — M25551 Pain in right hip: Secondary | ICD-10-CM | POA: Diagnosis not present

## 2022-06-02 DIAGNOSIS — M25552 Pain in left hip: Secondary | ICD-10-CM | POA: Diagnosis not present

## 2022-06-10 DIAGNOSIS — L821 Other seborrheic keratosis: Secondary | ICD-10-CM | POA: Diagnosis not present

## 2022-06-10 DIAGNOSIS — D225 Melanocytic nevi of trunk: Secondary | ICD-10-CM | POA: Diagnosis not present

## 2022-06-10 DIAGNOSIS — L814 Other melanin hyperpigmentation: Secondary | ICD-10-CM | POA: Diagnosis not present

## 2022-06-11 DIAGNOSIS — Z03818 Encounter for observation for suspected exposure to other biological agents ruled out: Secondary | ICD-10-CM | POA: Diagnosis not present

## 2022-06-11 DIAGNOSIS — J40 Bronchitis, not specified as acute or chronic: Secondary | ICD-10-CM | POA: Diagnosis not present

## 2022-06-24 DIAGNOSIS — M25552 Pain in left hip: Secondary | ICD-10-CM | POA: Diagnosis not present

## 2022-06-24 DIAGNOSIS — M25551 Pain in right hip: Secondary | ICD-10-CM | POA: Diagnosis not present

## 2022-06-28 DIAGNOSIS — M25552 Pain in left hip: Secondary | ICD-10-CM | POA: Diagnosis not present

## 2022-06-30 DIAGNOSIS — H18513 Endothelial corneal dystrophy, bilateral: Secondary | ICD-10-CM | POA: Diagnosis not present

## 2022-06-30 DIAGNOSIS — H2513 Age-related nuclear cataract, bilateral: Secondary | ICD-10-CM | POA: Diagnosis not present

## 2022-06-30 DIAGNOSIS — H401132 Primary open-angle glaucoma, bilateral, moderate stage: Secondary | ICD-10-CM | POA: Diagnosis not present

## 2022-06-30 DIAGNOSIS — H5213 Myopia, bilateral: Secondary | ICD-10-CM | POA: Diagnosis not present

## 2022-08-02 DIAGNOSIS — M25552 Pain in left hip: Secondary | ICD-10-CM | POA: Diagnosis not present

## 2022-09-20 ENCOUNTER — Ambulatory Visit (INDEPENDENT_AMBULATORY_CARE_PROVIDER_SITE_OTHER): Payer: Medicare Other | Admitting: Podiatry

## 2022-09-20 ENCOUNTER — Encounter: Payer: Self-pay | Admitting: Podiatry

## 2022-09-20 VITALS — BP 137/71 | HR 60

## 2022-09-20 DIAGNOSIS — L603 Nail dystrophy: Secondary | ICD-10-CM | POA: Diagnosis not present

## 2022-09-20 NOTE — Progress Notes (Signed)
  Subjective:  Patient ID: Misty Campos, female    DOB: [birth year],  MRN: [id number]  Chief Complaint  Patient presents with   Nail Problem       (npL great and 2nd toe nail deformity    62 y.o. female presents with the above complaint. History confirmed with patient.  Still has concerns of the toenail  Objective:  Physical Exam: warm, good capillary refill, no trophic changes or ulcerative lesions, normal DP and PT pulses and normal sensory exam. Left Foot: Dystrophic thick second toenail with reducible hammertoe deformity Assessment:   1. Nail dystrophy      Plan:  Patient was evaluated and treated and all questions answered.  Today not much change in symptoms.  We again discussed permanent removal of the toenail if this becomes symptomatic but I recommended she proceed with regular pedicures to maintain length and thickness of the nail to a comfortable level.  She will follow-up as needed.  No signs of infection.  Return if symptoms worsen or fail to improve.

## 2022-12-08 DIAGNOSIS — N841 Polyp of cervix uteri: Secondary | ICD-10-CM | POA: Diagnosis not present

## 2022-12-31 DIAGNOSIS — H401132 Primary open-angle glaucoma, bilateral, moderate stage: Secondary | ICD-10-CM | POA: Diagnosis not present

## 2022-12-31 DIAGNOSIS — H18513 Endothelial corneal dystrophy, bilateral: Secondary | ICD-10-CM | POA: Diagnosis not present

## 2023-02-08 DIAGNOSIS — Z01419 Encounter for gynecological examination (general) (routine) without abnormal findings: Secondary | ICD-10-CM | POA: Diagnosis not present

## 2023-02-08 DIAGNOSIS — Z124 Encounter for screening for malignant neoplasm of cervix: Secondary | ICD-10-CM | POA: Diagnosis not present

## 2023-02-08 DIAGNOSIS — Z1331 Encounter for screening for depression: Secondary | ICD-10-CM | POA: Diagnosis not present

## 2023-02-08 DIAGNOSIS — Z1231 Encounter for screening mammogram for malignant neoplasm of breast: Secondary | ICD-10-CM | POA: Diagnosis not present

## 2023-03-09 DIAGNOSIS — R92321 Mammographic fibroglandular density, right breast: Secondary | ICD-10-CM | POA: Diagnosis not present

## 2023-03-09 DIAGNOSIS — R928 Other abnormal and inconclusive findings on diagnostic imaging of breast: Secondary | ICD-10-CM | POA: Diagnosis not present

## 2023-03-15 DIAGNOSIS — Z1211 Encounter for screening for malignant neoplasm of colon: Secondary | ICD-10-CM | POA: Diagnosis not present

## 2023-03-15 DIAGNOSIS — E785 Hyperlipidemia, unspecified: Secondary | ICD-10-CM | POA: Diagnosis not present

## 2023-03-15 DIAGNOSIS — F3181 Bipolar II disorder: Secondary | ICD-10-CM | POA: Diagnosis not present

## 2023-03-15 DIAGNOSIS — Z1331 Encounter for screening for depression: Secondary | ICD-10-CM | POA: Diagnosis not present

## 2023-03-15 DIAGNOSIS — B0229 Other postherpetic nervous system involvement: Secondary | ICD-10-CM | POA: Diagnosis not present

## 2023-03-15 DIAGNOSIS — Z0000 Encounter for general adult medical examination without abnormal findings: Secondary | ICD-10-CM | POA: Diagnosis not present

## 2023-03-15 DIAGNOSIS — I10 Essential (primary) hypertension: Secondary | ICD-10-CM | POA: Diagnosis not present

## 2023-03-25 DIAGNOSIS — N6315 Unspecified lump in the right breast, overlapping quadrants: Secondary | ICD-10-CM | POA: Diagnosis not present

## 2023-03-25 DIAGNOSIS — N6489 Other specified disorders of breast: Secondary | ICD-10-CM | POA: Diagnosis not present

## 2023-03-25 DIAGNOSIS — R92321 Mammographic fibroglandular density, right breast: Secondary | ICD-10-CM | POA: Diagnosis not present

## 2023-03-27 ENCOUNTER — Emergency Department (HOSPITAL_BASED_OUTPATIENT_CLINIC_OR_DEPARTMENT_OTHER)
Admission: EM | Admit: 2023-03-27 | Discharge: 2023-03-27 | Disposition: A | Discharge status: Home / Self Care | Payer: Medicare Other | Attending: Emergency Medicine | Admitting: Emergency Medicine

## 2023-03-27 ENCOUNTER — Other Ambulatory Visit: Payer: Self-pay

## 2023-03-27 ENCOUNTER — Encounter (HOSPITAL_BASED_OUTPATIENT_CLINIC_OR_DEPARTMENT_OTHER): Payer: Self-pay | Admitting: Emergency Medicine

## 2023-03-27 DIAGNOSIS — Z4800 Encounter for change or removal of nonsurgical wound dressing: Secondary | ICD-10-CM | POA: Insufficient documentation

## 2023-03-27 DIAGNOSIS — S20121A Blister (nonthermal) of breast, right breast, initial encounter: Secondary | ICD-10-CM | POA: Insufficient documentation

## 2023-03-27 DIAGNOSIS — Z5189 Encounter for other specified aftercare: Secondary | ICD-10-CM

## 2023-03-27 DIAGNOSIS — X58XXXA Exposure to other specified factors, initial encounter: Secondary | ICD-10-CM | POA: Diagnosis not present

## 2023-03-27 DIAGNOSIS — Z4801 Encounter for change or removal of surgical wound dressing: Secondary | ICD-10-CM | POA: Diagnosis not present

## 2023-03-27 NOTE — ED Triage Notes (Signed)
Pt had RT breast bx on Fri; has small blister-like area next to incision now

## 2023-03-27 NOTE — ED Provider Notes (Signed)
[facility] Provider Note   CSN: [id number] Arrival date & time: 03/27/23  1503     History  Chief Complaint  Patient presents with   Post-op Problem    Misty Campos is a 62 y.o. female.  Patient status post right breast biopsy on Friday.  Patient remove the dressing and she has a blister on the right breast away from the biopsy site.       Home Medications Prior to Admission medications   Medication Sig Start Date End Date Taking? Authorizing Provider  acyclovir (ZOVIRAX) 400 MG tablet Take 400 mg by mouth 2 (two) times daily. 09/12/17   [provider]  gabapentin (NEURONTIN) 100 MG capsule Take 1 capsule by mouth 2 (two) times daily. 10/11/17   [provider]  linaclotide Karlene Einstein) 72 MCG capsule Take 1 capsule (72 mcg total) by mouth daily before breakfast. 04/09/21   Esterwood, Amy S, PA-C  meclizine (ANTIVERT) 25 MG tablet Take 1 tablet (25 mg total) by mouth 2 (two) times daily as needed for up to 10 doses for dizziness. 04/28/22   Curatolo, Adam, DO  methylPREDNISolone (MEDROL DOSEPAK) 4 MG TBPK tablet Follow package insert Patient not taking: Reported on 09/20/2022 04/28/22   Virgina Norfolk, DO  naproxen (NAPROSYN) 500 MG tablet naproxen 500 mg tablet    [provider]  senna-docusate (SENOKOT-S) 8.6-50 MG tablet Stool Softener-Laxative 8.6 mg-50 mg tablet  2 TABLETS AT BEDTIME ORALLY 30 DAY(S)    [provider]  timolol (TIMOPTIC) 0.25 % ophthalmic solution 1 drop 2 (two) times daily.    [provider]      Allergies    Patient has no known allergies.    Review of Systems   Review of Systems  Constitutional:  Negative for chills and fever.  HENT:  Negative for ear pain and sore throat.   Eyes:  Negative for pain and visual disturbance.  Respiratory:  Negative for cough and shortness of breath.   Cardiovascular:  Negative for chest pain and palpitations.   Gastrointestinal:  Negative for abdominal pain and vomiting.  Genitourinary:  Negative for dysuria and hematuria.  Musculoskeletal:  Negative for arthralgias and back pain.  Skin:  Positive for wound. Negative for color change and rash.  Neurological:  Negative for seizures and syncope.  All other systems reviewed and are negative.   Physical Exam Updated Vital Signs BP (!) 158/92 (BP Location: Right Arm)   Pulse 74   Temp 98.2 F (36.8 C)   Resp 18   Ht 1.702 m (5\' 7" )   Wt 86.2 kg   SpO2 97%   BMI 29.76 kg/m  Physical Exam Vitals and nursing note reviewed.  Constitutional:      General: She is not in acute distress.    Appearance: Normal appearance. She is well-developed.  HENT:     Head: Normocephalic and atraumatic.  Eyes:     Extraocular Movements: Extraocular movements intact.     Conjunctiva/sclera: Conjunctivae normal.     Pupils: Pupils are equal, round, and reactive to light.  Cardiovascular:     Rate and Rhythm: Normal rate and regular rhythm.     Heart sounds: No murmur heard. Pulmonary:     Effort: Pulmonary effort is normal. No respiratory distress.     Breath sounds: Normal breath sounds.     Comments: Right breast without any erythema.  Biopsy site with Steri-Strips in place.  Just a little bit medial  to that there is a 1 cm blister that is intact.  Clear fluid no erythema.  No signs of any infection. Abdominal:     Palpations: Abdomen is soft.     Tenderness: There is no abdominal tenderness.  Musculoskeletal:        General: No swelling.     Cervical back: Neck supple.  Skin:    General: Skin is warm and dry.     Capillary Refill: Capillary refill takes less than 2 seconds.  Neurological:     General: No focal deficit present.     Mental Status: She is alert and oriented to person, place, and time.  Psychiatric:        Mood and Affect: Mood normal.     ED Results / Procedures / Treatments   Labs (all labs ordered are listed, but only  abnormal results are displayed) Labs Reviewed - No data to display  EKG None  Radiology No results found.  Procedures Procedures    Medications Ordered in ED Medications - No data to display  ED Course/ Medical Decision Making/ A&P                                 Medical Decision Making  Patient with a blister at the medial aspect of the right breast biopsy no signs of any infection or secondary infection.  Blister is intact.  Suspect is probably from tape or the dressing.  Instructed patient to try to keep the blister intact is much as possible.  But if it does open up apply antibiotic ointment like Polysporin or Neosporin 3 times a day.  Patient has follow-up from the surgical procedure on February 6.   Final Clinical Impression(s) / ED Diagnoses Final diagnoses:  Visit for wound check  Blister of right breast without infection, initial encounter    Rx / DC Orders ED Discharge Orders     None         Vanetta Mulders, MD 03/27/23 1642

## 2023-03-27 NOTE — Discharge Instructions (Signed)
Follow-up with your surgeons as scheduled for February 6.  Try to keep blister intact.  If it does open up apply antibiotic ointment 3 times a day.  No evidence of any significant infection here today.  The biopsy site seems to be healing well.  Follow-up with her primary care doctor as needed.

## 2023-03-28 DIAGNOSIS — C50911 Malignant neoplasm of unspecified site of right female breast: Secondary | ICD-10-CM | POA: Diagnosis not present

## 2023-04-06 DIAGNOSIS — C50911 Malignant neoplasm of unspecified site of right female breast: Secondary | ICD-10-CM | POA: Diagnosis not present

## 2023-04-19 DIAGNOSIS — Z9189 Other specified personal risk factors, not elsewhere classified: Secondary | ICD-10-CM | POA: Diagnosis not present

## 2023-04-19 DIAGNOSIS — E28319 Asymptomatic premature menopause: Secondary | ICD-10-CM | POA: Diagnosis not present

## 2023-04-19 DIAGNOSIS — C50911 Malignant neoplasm of unspecified site of right female breast: Secondary | ICD-10-CM | POA: Diagnosis not present

## 2023-04-29 DIAGNOSIS — M85851 Other specified disorders of bone density and structure, right thigh: Secondary | ICD-10-CM | POA: Diagnosis not present

## 2023-04-29 DIAGNOSIS — C50911 Malignant neoplasm of unspecified site of right female breast: Secondary | ICD-10-CM | POA: Diagnosis not present

## 2023-04-29 DIAGNOSIS — M85852 Other specified disorders of bone density and structure, left thigh: Secondary | ICD-10-CM | POA: Diagnosis not present

## 2023-04-29 DIAGNOSIS — M8589 Other specified disorders of bone density and structure, multiple sites: Secondary | ICD-10-CM | POA: Diagnosis not present

## 2023-05-03 DIAGNOSIS — E785 Hyperlipidemia, unspecified: Secondary | ICD-10-CM | POA: Diagnosis not present

## 2023-05-03 DIAGNOSIS — C50911 Malignant neoplasm of unspecified site of right female breast: Secondary | ICD-10-CM | POA: Diagnosis not present

## 2023-05-03 DIAGNOSIS — Z170 Estrogen receptor positive status [ER+]: Secondary | ICD-10-CM | POA: Diagnosis not present

## 2023-05-03 DIAGNOSIS — I10 Essential (primary) hypertension: Secondary | ICD-10-CM | POA: Diagnosis not present

## 2023-05-03 DIAGNOSIS — E669 Obesity, unspecified: Secondary | ICD-10-CM | POA: Diagnosis not present

## 2023-05-03 DIAGNOSIS — F319 Bipolar disorder, unspecified: Secondary | ICD-10-CM | POA: Diagnosis not present

## 2023-05-03 DIAGNOSIS — Z6830 Body mass index (BMI) 30.0-30.9, adult: Secondary | ICD-10-CM | POA: Diagnosis not present

## 2023-05-03 DIAGNOSIS — Z79899 Other long term (current) drug therapy: Secondary | ICD-10-CM | POA: Diagnosis not present

## 2023-05-05 DIAGNOSIS — C50911 Malignant neoplasm of unspecified site of right female breast: Secondary | ICD-10-CM | POA: Diagnosis not present

## 2023-05-05 DIAGNOSIS — Z17411 Hormone receptor positive with human epidermal growth factor receptor 2 negative status: Secondary | ICD-10-CM | POA: Diagnosis not present

## 2023-05-17 DIAGNOSIS — M85851 Other specified disorders of bone density and structure, right thigh: Secondary | ICD-10-CM | POA: Diagnosis not present

## 2023-05-17 DIAGNOSIS — M85852 Other specified disorders of bone density and structure, left thigh: Secondary | ICD-10-CM | POA: Diagnosis not present

## 2023-05-17 DIAGNOSIS — C50911 Malignant neoplasm of unspecified site of right female breast: Secondary | ICD-10-CM | POA: Diagnosis not present

## 2023-05-20 DIAGNOSIS — Z170 Estrogen receptor positive status [ER+]: Secondary | ICD-10-CM | POA: Diagnosis not present

## 2023-05-20 DIAGNOSIS — C50811 Malignant neoplasm of overlapping sites of right female breast: Secondary | ICD-10-CM | POA: Diagnosis not present

## 2023-05-25 DIAGNOSIS — Z170 Estrogen receptor positive status [ER+]: Secondary | ICD-10-CM | POA: Diagnosis not present

## 2023-05-25 DIAGNOSIS — C50811 Malignant neoplasm of overlapping sites of right female breast: Secondary | ICD-10-CM | POA: Diagnosis not present

## 2023-05-25 DIAGNOSIS — Z510 Encounter for antineoplastic radiation therapy: Secondary | ICD-10-CM | POA: Diagnosis not present

## 2023-05-27 DIAGNOSIS — Z170 Estrogen receptor positive status [ER+]: Secondary | ICD-10-CM | POA: Diagnosis not present

## 2023-05-27 DIAGNOSIS — C50811 Malignant neoplasm of overlapping sites of right female breast: Secondary | ICD-10-CM | POA: Diagnosis not present

## 2023-05-27 DIAGNOSIS — Z510 Encounter for antineoplastic radiation therapy: Secondary | ICD-10-CM | POA: Diagnosis not present

## 2023-06-06 DIAGNOSIS — C50811 Malignant neoplasm of overlapping sites of right female breast: Secondary | ICD-10-CM | POA: Diagnosis not present

## 2023-06-06 DIAGNOSIS — Z510 Encounter for antineoplastic radiation therapy: Secondary | ICD-10-CM | POA: Diagnosis not present

## 2023-06-06 DIAGNOSIS — Z170 Estrogen receptor positive status [ER+]: Secondary | ICD-10-CM | POA: Diagnosis not present

## 2023-06-08 DIAGNOSIS — Z510 Encounter for antineoplastic radiation therapy: Secondary | ICD-10-CM | POA: Diagnosis not present

## 2023-06-08 DIAGNOSIS — C50811 Malignant neoplasm of overlapping sites of right female breast: Secondary | ICD-10-CM | POA: Diagnosis not present

## 2023-06-08 DIAGNOSIS — Z170 Estrogen receptor positive status [ER+]: Secondary | ICD-10-CM | POA: Diagnosis not present

## 2023-06-10 DIAGNOSIS — Z510 Encounter for antineoplastic radiation therapy: Secondary | ICD-10-CM | POA: Diagnosis not present

## 2023-06-10 DIAGNOSIS — C50811 Malignant neoplasm of overlapping sites of right female breast: Secondary | ICD-10-CM | POA: Diagnosis not present

## 2023-06-10 DIAGNOSIS — Z170 Estrogen receptor positive status [ER+]: Secondary | ICD-10-CM | POA: Diagnosis not present

## 2023-06-18 ENCOUNTER — Other Ambulatory Visit: Payer: Self-pay

## 2023-06-18 ENCOUNTER — Encounter (HOSPITAL_BASED_OUTPATIENT_CLINIC_OR_DEPARTMENT_OTHER): Payer: Self-pay | Admitting: Emergency Medicine

## 2023-06-18 ENCOUNTER — Emergency Department (HOSPITAL_BASED_OUTPATIENT_CLINIC_OR_DEPARTMENT_OTHER)

## 2023-06-18 ENCOUNTER — Emergency Department (HOSPITAL_BASED_OUTPATIENT_CLINIC_OR_DEPARTMENT_OTHER)
Admission: EM | Admit: 2023-06-18 | Discharge: 2023-06-18 | Disposition: A | Discharge status: Home / Self Care | Attending: Emergency Medicine | Admitting: Emergency Medicine

## 2023-06-18 DIAGNOSIS — Z853 Personal history of malignant neoplasm of breast: Secondary | ICD-10-CM | POA: Insufficient documentation

## 2023-06-18 DIAGNOSIS — R059 Cough, unspecified: Secondary | ICD-10-CM | POA: Diagnosis not present

## 2023-06-18 DIAGNOSIS — B9789 Other viral agents as the cause of diseases classified elsewhere: Secondary | ICD-10-CM | POA: Diagnosis not present

## 2023-06-18 DIAGNOSIS — Z79899 Other long term (current) drug therapy: Secondary | ICD-10-CM | POA: Diagnosis not present

## 2023-06-18 DIAGNOSIS — J069 Acute upper respiratory infection, unspecified: Secondary | ICD-10-CM | POA: Diagnosis not present

## 2023-06-18 LAB — RESP PANEL BY RT-PCR (RSV, FLU A&B, COVID)  RVPGX2
Influenza A by PCR: NEGATIVE
Influenza B by PCR: NEGATIVE
Resp Syncytial Virus by PCR: NEGATIVE
SARS Coronavirus 2 by RT PCR: NEGATIVE

## 2023-06-18 MED ORDER — ALBUTEROL SULFATE HFA 108 (90 BASE) MCG/ACT IN AERS
2.0000 | INHALATION_SPRAY | Freq: Once | RESPIRATORY_TRACT | Status: AC
  Administered 2023-06-18: 2 via RESPIRATORY_TRACT
  Filled 2023-06-18: qty 6.7

## 2023-06-18 MED ORDER — BENZONATATE 100 MG PO CAPS: 100.0000 mg | ORAL_CAPSULE | Freq: Three times a day (TID) | ORAL | 0 refills | Status: AC

## 2023-06-18 MED ORDER — AEROCHAMBER PLUS FLO-VU MEDIUM MISC
1.0000 | Freq: Once | Status: AC
  Administered 2023-06-18: 1
  Filled 2023-06-18: qty 1

## 2023-06-18 MED ORDER — BENZONATATE 100 MG PO CAPS
100.0000 mg | ORAL_CAPSULE | Freq: Once | ORAL | Status: AC
  Administered 2023-06-18: 100 mg via ORAL
  Filled 2023-06-18: qty 1

## 2023-06-18 NOTE — ED Provider Notes (Signed)
 [name] Provider Note   CSN: [id number] Arrival date & time: 06/18/23  1344     History  Chief Complaint  Patient presents with   Cough    Misty Campos is a 63 y.o. female.  With a history of breast cancer status post resection now on radiation who presents to the ED for coughing.  Patient has experienced 1 week of ongoing dry cough.  She is currently undergoing radiation and has had 3 of the 5 scheduled treatments thus far but had to cancel the last 1 last week due to ongoing coughing.  She is here hoping for some relief of her cough.  Otherwise she feels fatigued but well overall.  No chest pain fevers nausea vomiting diaphoresis chills or shortness of breath.   Cough      Home Medications Prior to Admission medications   Medication Sig Start Date End Date Taking? Authorizing Provider  benzonatate (TESSALON) 100 MG capsule Take 1 capsule (100 mg total) by mouth every 8 (eight) hours. 06/18/23  Yes Sallyanne Creamer, DO  acyclovir (ZOVIRAX) 400 MG tablet Take 400 mg by mouth 2 (two) times daily. 09/12/17   [provider]  gabapentin (NEURONTIN) 100 MG capsule Take 1 capsule by mouth 2 (two) times daily. 10/11/17   [provider]  linaclotide  (LINZESS ) 72 MCG capsule Take 1 capsule (72 mcg total) by mouth daily before breakfast. 04/09/21   Esterwood, Amy S, PA-C  meclizine  (ANTIVERT ) 25 MG tablet Take 1 tablet (25 mg total) by mouth 2 (two) times daily as needed for up to 10 doses for dizziness. 04/28/22   Curatolo, Adam, DO  methylPREDNISolone  (MEDROL  DOSEPAK) 4 MG TBPK tablet Follow package insert Patient not taking: Reported on 09/20/2022 04/28/22   Lowery Rue, DO  naproxen (NAPROSYN) 500 MG tablet naproxen 500 mg tablet    [provider]  senna-docusate (SENOKOT-S) 8.6-50 MG tablet Stool Softener-Laxative 8.6 mg-50 mg tablet  2 TABLETS AT BEDTIME ORALLY 30 DAY(S)    [provider]   timolol (TIMOPTIC) 0.25 % ophthalmic solution 1 drop 2 (two) times daily.    [provider]      Allergies    Patient has no known allergies.    Review of Systems   Review of Systems  Respiratory:  Positive for cough.     Physical Exam Updated Vital Signs BP 116/89 (BP Location: Right Arm)   Pulse 84   Temp 98.4 F (36.9 C) (Oral)   Resp 20   Ht 5\' 7"  (1.702 m)   Wt 86.2 kg   SpO2 96%   BMI 29.76 kg/m  Physical Exam Vitals and nursing note reviewed.  HENT:     Head: Normocephalic and atraumatic.  Eyes:     Pupils: Pupils are equal, round, and reactive to light.  Cardiovascular:     Rate and Rhythm: Normal rate and regular rhythm.  Pulmonary:     Effort: Pulmonary effort is normal.     Breath sounds: Normal breath sounds.  Abdominal:     Palpations: Abdomen is soft.     Tenderness: There is no abdominal tenderness.  Skin:    General: Skin is warm and dry.  Neurological:     Mental Status: She is alert.  Psychiatric:        Mood and Affect: Mood normal.     ED Results / Procedures / Treatments   Labs (all labs ordered are listed, but only abnormal  results are displayed) Labs Reviewed  RESP PANEL BY RT-PCR (RSV, FLU A&B, COVID)  [id number]    EKG None  Radiology DG Chest 2 View Result Date: 06/18/2023 CLINICAL DATA:  Cough EXAM: CHEST - 2 VIEW COMPARISON:  None Available. FINDINGS: The heart size and mediastinal contours are within normal limits. Both lungs are clear. The visualized skeletal structures are unremarkable. IMPRESSION: No active cardiopulmonary disease. Electronically Signed   By: Violeta Grey M.D.   On: 06/18/2023 15:18    Procedures Procedures    Medications Ordered in ED Medications  benzonatate (TESSALON) capsule 100 mg (has no administration in time range)  albuterol (VENTOLIN HFA) 108 (90 Base) MCG/ACT inhaler 2 puff (has no administration in time range)  AeroChamber Plus Flo-Vu Medium MISC 1 each (has no administration in  time range)    ED Course/ Medical Decision Making/ A&P                                 Medical Decision Making 63 year old female with history as above coming in for 1 week of dry cough.  Currently undergoing radiation for right sided breast cancer status post resection.  Well-appearing otherwise afebrile normotensive.  No adventitious lung sounds on exam but she does report wheezing.  Obtain chest x-ray to evaluate for pneumonia no evidence of focal consolidation or other acute abnormality on chest x-ray.  Obtained viral respiratory swab which to evaluate for COVID flu RSV as a cause of her cough but this result is not back yet nor does she need to stay and wait for the result she will follow-up, epic MyChart.  As far as her ongoing dry cough it may be related to seasonal allergies versus a mild viral bronchitis versus radiation pneumonitis.  Will give albuterol MDI and Tessalon Perles for symptomatic management and instruct for PCP/oncology follow-up  Risk Prescription drug management.           Final Clinical Impression(s) / ED Diagnoses Final diagnoses:  Viral URI with cough    Rx / DC Orders ED Discharge Orders          Ordered    benzonatate (TESSALON) 100 MG capsule  Every 8 hours        06/18/23 1548              Sallyanne Creamer, DO 06/18/23 1549

## 2023-06-18 NOTE — Discharge Instructions (Addendum)
 You were seen in the Emergency Department for coughing Your chest x-ray looked normal did not show any evidence of pneumonia The results of your COVID influenza RSV test are still pending and you can follow these on MyChart We gave you an albuterol inhaler to help with the coughing as well as a prescription for Tessalon Perles to pick up from your pharmacy You can use the albuterol inhaler 2 puffs up to every 4 hours and take 1 Tessalon capsule up to every 8 hours for cough relief Follow-up with your primary doctor and radiation team as scheduled Return to the emergency room for severe coughing, shortness of breath, chest pain or any other concerns

## 2023-06-18 NOTE — ED Notes (Signed)
 No resp distress, speaking in complete sentences.

## 2023-06-18 NOTE — ED Triage Notes (Signed)
 Pt with cough and she is concerned because she is receiving radiation

## 2023-06-20 DIAGNOSIS — R051 Acute cough: Secondary | ICD-10-CM | POA: Diagnosis not present

## 2023-06-20 DIAGNOSIS — C50911 Malignant neoplasm of unspecified site of right female breast: Secondary | ICD-10-CM | POA: Diagnosis not present

## 2023-06-20 DIAGNOSIS — E785 Hyperlipidemia, unspecified: Secondary | ICD-10-CM | POA: Diagnosis not present

## 2023-06-21 DIAGNOSIS — C50811 Malignant neoplasm of overlapping sites of right female breast: Secondary | ICD-10-CM | POA: Diagnosis not present

## 2023-06-21 DIAGNOSIS — Z510 Encounter for antineoplastic radiation therapy: Secondary | ICD-10-CM | POA: Diagnosis not present

## 2023-06-21 DIAGNOSIS — Z170 Estrogen receptor positive status [ER+]: Secondary | ICD-10-CM | POA: Diagnosis not present

## 2023-06-23 DIAGNOSIS — Z510 Encounter for antineoplastic radiation therapy: Secondary | ICD-10-CM | POA: Diagnosis not present

## 2023-06-23 DIAGNOSIS — Z170 Estrogen receptor positive status [ER+]: Secondary | ICD-10-CM | POA: Diagnosis not present

## 2023-06-23 DIAGNOSIS — C50811 Malignant neoplasm of overlapping sites of right female breast: Secondary | ICD-10-CM | POA: Diagnosis not present

## 2023-07-01 DIAGNOSIS — H2513 Age-related nuclear cataract, bilateral: Secondary | ICD-10-CM | POA: Diagnosis not present

## 2023-07-01 DIAGNOSIS — H401132 Primary open-angle glaucoma, bilateral, moderate stage: Secondary | ICD-10-CM | POA: Diagnosis not present

## 2023-07-01 DIAGNOSIS — H5213 Myopia, bilateral: Secondary | ICD-10-CM | POA: Diagnosis not present

## 2023-07-20 DIAGNOSIS — L821 Other seborrheic keratosis: Secondary | ICD-10-CM | POA: Diagnosis not present

## 2023-07-20 DIAGNOSIS — L814 Other melanin hyperpigmentation: Secondary | ICD-10-CM | POA: Diagnosis not present

## 2023-07-20 DIAGNOSIS — D225 Melanocytic nevi of trunk: Secondary | ICD-10-CM | POA: Diagnosis not present

## 2023-08-16 DIAGNOSIS — D649 Anemia, unspecified: Secondary | ICD-10-CM | POA: Diagnosis not present

## 2023-08-16 DIAGNOSIS — Z79811 Long term (current) use of aromatase inhibitors: Secondary | ICD-10-CM | POA: Diagnosis not present

## 2023-08-16 DIAGNOSIS — C50911 Malignant neoplasm of unspecified site of right female breast: Secondary | ICD-10-CM | POA: Diagnosis not present

## 2023-08-16 DIAGNOSIS — M85851 Other specified disorders of bone density and structure, right thigh: Secondary | ICD-10-CM | POA: Diagnosis not present

## 2023-08-16 DIAGNOSIS — M85852 Other specified disorders of bone density and structure, left thigh: Secondary | ICD-10-CM | POA: Diagnosis not present

## 2023-08-16 NOTE — Progress Notes (Signed)
 Patient ID:  Misty Campos is a 63 y.o. (DOB 09-28-1960) female.    Assessment and Plan   Assessment & Plan 1. Stage IA right invasive ductal carcinoma. pT2N0M0, grade 1, ER 100%, PR 80%, HER2 2+ FISH not amplified    We reviewed the fact that her surgical resection with Dr. Adrianne was curative.  She was noted to have a 2.2 cm primary.  3 lymph nodes were removed and all 3 were negative.  This is a grade 1 tumor.  It is estrogen positive at 100%, progesterone positive at 80% and HER2 2+ FISH not amplified.  Oncotype DX testing was declined.   She has completed adjuvant radiation. She is currently undergoing endocrine therapy with letrozole, which she is tolerating well overall. The available lab work, including a CBC, has been reviewed and is overall normal except for mild normocytic anemia. The skin discoloration near her surgical scar was discussed, which is likely due to prior radiation therapy and may not completely resolve. A refill for letrozole has been provided today. The current regimen of letrozole will be continued as prescribed. This condition will continue to be monitored. A clinical evaluation is planned in 3 months. The next mammogram is due in 01/2024 and will be ordered at her next visit.  2. Osteopenia. This is age-related osteopenia. A bone density scan conducted on 04/29/2023 revealed osteopenia involving both the right and left femoral neck. The current supplementation of vitamin D and calcium will be continued. This condition will continue to be monitored.  3. Normocytic anemia. The CBC from today indicates a slight decrease in her hemoglobin level to 12.1. This condition will be monitored in future lab work.  Follow-up The patient will follow up in approximately 3 months.    Subjective   Patient ID:  Misty Campos is a 63 y.o. (DOB 06-19-1960) female    Patient presents with  . Follow-up    History of Present Illness The patient is a  63 year old female who is seen today for routine follow-up for right breast cancer. Currently, she is on letrozole. She also has a diagnosis of osteopenia and is on vitamin D and calcium supplementation.  She reports no adverse effects from the letrozole, including bone pain or hot flashes. She experiences mild dizziness, a symptom present prior to starting letrozole. She does not experience headaches, stomach pain, nausea, or vomiting. She expresses concern about potential discomfort during her next mammogram due to tissue sensitivity. She reports no palpable lumps or bumps in her breasts and no skin changes of concern.   She occasionally experiences minor indentations from her bra strap, which resolve spontaneously. She has observed some discoloration at the site of her radiation treatment and wonders if this will resolve naturally. She underwent radiation therapy for her breast cancer and was informed that the dose was low. She is currently on letrozole and recently refilled her prescription.  Oncology History  Invasive ductal carcinoma of breast, female, right (*)  04/06/2023 Initial Diagnosis   Invasive ductal carcinoma of breast, female, right  (*)   04/06/2023 Cancer Staged   Staging form: Breast, AJCC 8th Edition - Clinical stage from 04/06/2023: Stage IB (cT2, cN0, cM0, G1, ER+, PR+, HER2-) - Signed by Delon DELENA Adrianne, MD on 04/09/2023    05/10/2023 Cancer Staged   Staging form: Breast, AJCC 8th Edition - Pathologic stage from 05/10/2023: Stage IA (pT2, pN0, cM0, G1, ER+, PR+, HER2-) - Signed by Delon DELENA Adrianne, MD on 05/13/2023  05/17/2023 -  Chemotherapy   letrozole (FEMARA) 2.5 MG tablet, 2.5 mg, Oral, Daily, 0 of 1 cycle, Start date: --, End date: --      06/06/2023 - 06/23/2023 Radiation Oncology   Treated to a dose of 30 Gy in 5 fractions to the right partial breast treated every other day.        Reviewed and updated this visit by provider: Tobacco  Allergies   Meds  Problems  Med Hx  Surg Hx  Fam Hx        Review of Systems  Constitutional:  Negative for chills, fatigue and fever.  HENT: Negative.    Eyes: Negative.   Respiratory:  Negative for cough, chest tightness and shortness of breath.   Cardiovascular:  Negative for chest pain and leg swelling.  Gastrointestinal:  Negative for abdominal pain, constipation, diarrhea, nausea and vomiting.  Endocrine: Negative.   Genitourinary: Negative.   Musculoskeletal: Negative.   Skin: Negative.   Neurological:  Negative for dizziness, seizures and headaches.  Hematological: Negative.   Psychiatric/Behavioral: Negative.       Objective   Vitals:   08/16/23 1326  BP: 127/78  Patient Position: Sitting  Pulse: 84  Temp: 97.1 F (36.2 C)  TempSrc: Temporal  Resp: 18  Height: 5' 7 (1.702 m)  Weight: 179 lb (81.2 kg)  SpO2: 96%  BMI (Calculated): 28  PainSc: 0-No pain    ECOG Performance Status: 0 - Asymptomatic  Physical Exam Vitals reviewed.  Constitutional:      General: She is not in acute distress. HENT:     Head: Normocephalic.  Eyes:     General: No scleral icterus.    Conjunctiva/sclera: Conjunctivae normal.  Cardiovascular:     Rate and Rhythm: Normal rate and regular rhythm.  Musculoskeletal:        General: Normal range of motion.     Right lower leg: No edema.     Left lower leg: No edema.  Pulmonary:     Effort: Pulmonary effort is normal. No respiratory distress.     Breath sounds: Normal breath sounds.  Chest:     Comments: Left breast without palpable masses or nipple discharge.  Right breast with a well-healed surgical scar.  There was no overlying nodularity or erythema.  Mild skin discoloration close to surgical site secondary to prior radiation.  No palpable axillary lymphadenopathy bilaterally. Abdominal:     General: Bowel sounds are normal.     Palpations: Abdomen is soft.     Tenderness: There is no abdominal tenderness.  Skin:    General:  Skin is warm and dry.  Neurological:     Mental Status: She is alert and oriented to person, place, and time.  Psychiatric:        Mood and Affect: Mood normal.        Behavior: Behavior normal.        Thought Content: Thought content normal.        Judgment: Judgment normal.       Results Labs  - CBC: 08/16/2023, WBC 5.8, hemoglobin 12.1, MCV 87.2, platelet count 219,000  Recent Results (from the past 72 hours)  CBC And Differential   Collection Time: 08/16/23  1:23 PM  Result Value Ref Range   WBC 5.8 3.7 - 11.0 thou/mcL   RBC 3.98 (L) 4.01 - 4.90 million/mcL   HGB 12.1 (L) 12.2 - 14.9 gm/dL   HCT 65.2 (L) 64.1 - 52.0 %   MCV 87.2 82.0 -  98.0 fL   MCH 30.4 27.0 - 33.0 pg   MCHC 34.9 31.0 - 37.0 gm/dL   Plt Ct 780 849 - 599 thou/mcL   RDW SD 41.8 36.0 - 47.0 fL   RDW CV 13.0 11.8 - 14.9 %   NEUTROPHIL % 58.7 %   LYMPHOCYTE % 31.8 %   MONOCYTE % 6.6 %   Eosinophil % 1.7 %   BASOPHIL % 1.0 %   ABSOLUTE NEUTROPHIL COUNT 3.37 1.50 - 7.50 thou/mcL   ABSOLUTE LYMPHOCYTE COUNT 1.83 1.00 - 4.50 thou/mcL   Absolute Monocyte Count 0.38 0.10 - 0.80 thou/mcL   Absolute Eosinophil Count 0.10 0.00 - 0.50 thou/mcL   Absolute Basophil Count 0.06 0.00 - 0.20 thou/mcL      Recent Results (from the past 72 hours)  Comprehensive Metabolic Panel   Collection Time: 08/16/23  1:23 PM  Result Value Ref Range   Glucose 89 70 - 99 mg/dL   BUN 15 8 - 27 mg/dL   Creatinine 9.34 9.42 - 1.00 mg/dL   eGFR 899 >40 fO/fpw/8.26   BUN/Creatinine Ratio 23 12 - 28   Sodium 141 134 - 144 mmol/L   Potassium 4.4 3.5 - 5.2 mmol/L   Chloride 105 96 - 106 mmol/L   CO2 18 (L) 20 - 29 mmol/L   CALCIUM 9.6 8.7 - 10.3 mg/dL   Total Protein 6.9 6.0 - 8.5 g/dL   Albumin, Serum 4.3 3.9 - 4.9 g/dL   Globulin, Total 2.6 1.5 - 4.5 g/dL   Total Bilirubin 0.3 0.0 - 1.2 mg/dL   Alkaline Phosphatase 97 44 - 121 IU/L   AST 17 0 - 40 IU/L   ALT (SGPT) 17 0 - 32 IU/L       Allean JONELLE Bud, NP seen for and  under the supervision of Dr. Johnie Door.  08/17/2023, 10:25 AM   Computer technology was used to create visit note. Consent from the patient/caregiver was obtained prior to its use.

## 2023-09-12 DIAGNOSIS — E785 Hyperlipidemia, unspecified: Secondary | ICD-10-CM | POA: Diagnosis not present

## 2023-09-12 DIAGNOSIS — I10 Essential (primary) hypertension: Secondary | ICD-10-CM | POA: Diagnosis not present

## 2023-09-12 DIAGNOSIS — E6609 Other obesity due to excess calories: Secondary | ICD-10-CM | POA: Diagnosis not present

## 2023-09-12 DIAGNOSIS — C50911 Malignant neoplasm of unspecified site of right female breast: Secondary | ICD-10-CM | POA: Diagnosis not present

## 2023-09-12 DIAGNOSIS — Z6830 Body mass index (BMI) 30.0-30.9, adult: Secondary | ICD-10-CM | POA: Diagnosis not present

## 2023-09-12 DIAGNOSIS — Z1211 Encounter for screening for malignant neoplasm of colon: Secondary | ICD-10-CM | POA: Diagnosis not present

## 2023-09-12 DIAGNOSIS — F3181 Bipolar II disorder: Secondary | ICD-10-CM | POA: Diagnosis not present

## 2023-09-12 DIAGNOSIS — E66811 Obesity, class 1: Secondary | ICD-10-CM | POA: Diagnosis not present

## 2023-09-21 DIAGNOSIS — Z1211 Encounter for screening for malignant neoplasm of colon: Secondary | ICD-10-CM | POA: Diagnosis not present

## 2023-09-30 DIAGNOSIS — E785 Hyperlipidemia, unspecified: Secondary | ICD-10-CM | POA: Diagnosis not present

## 2023-09-30 DIAGNOSIS — I10 Essential (primary) hypertension: Secondary | ICD-10-CM | POA: Diagnosis not present

## 2023-09-30 DIAGNOSIS — E6689 Other obesity not elsewhere classified: Secondary | ICD-10-CM | POA: Diagnosis not present

## 2023-09-30 DIAGNOSIS — Z713 Dietary counseling and surveillance: Secondary | ICD-10-CM | POA: Diagnosis not present

## 2023-10-09 ENCOUNTER — Other Ambulatory Visit: Payer: Self-pay

## 2023-10-09 ENCOUNTER — Emergency Department (HOSPITAL_BASED_OUTPATIENT_CLINIC_OR_DEPARTMENT_OTHER)

## 2023-10-09 ENCOUNTER — Emergency Department (HOSPITAL_BASED_OUTPATIENT_CLINIC_OR_DEPARTMENT_OTHER): Admission: EM | Admit: 2023-10-09 | Discharge: 2023-10-09 | Disposition: A | Discharge status: Home / Self Care

## 2023-10-09 ENCOUNTER — Encounter (HOSPITAL_BASED_OUTPATIENT_CLINIC_OR_DEPARTMENT_OTHER): Payer: Self-pay | Admitting: Emergency Medicine

## 2023-10-09 DIAGNOSIS — W228XXA Striking against or struck by other objects, initial encounter: Secondary | ICD-10-CM | POA: Diagnosis not present

## 2023-10-09 DIAGNOSIS — S92515A Nondisplaced fracture of proximal phalanx of left lesser toe(s), initial encounter for closed fracture: Secondary | ICD-10-CM | POA: Diagnosis not present

## 2023-10-09 DIAGNOSIS — S92505A Nondisplaced unspecified fracture of left lesser toe(s), initial encounter for closed fracture: Secondary | ICD-10-CM | POA: Diagnosis not present

## 2023-10-09 DIAGNOSIS — S99922A Unspecified injury of left foot, initial encounter: Secondary | ICD-10-CM | POA: Diagnosis not present

## 2023-10-09 DIAGNOSIS — M79672 Pain in left foot: Secondary | ICD-10-CM | POA: Diagnosis present

## 2023-10-09 NOTE — ED Notes (Signed)
 X-ray at bedside.

## 2023-10-09 NOTE — ED Triage Notes (Signed)
 Pt c/o toe pain after she hit her toe on a box this am

## 2023-10-09 NOTE — ED Provider Notes (Signed)
 [place] [facility] Provider Note   CSN: [id number] Arrival date & time: 10/09/23  2118     Patient presents with: Toe Injury   Misty Campos is a 63 y.o. female.   HPI   Patient states that she had hit her fifth toe on her left foot this morning.  This has been having pain.  Pain with walking.  No open wound.    Prior to Admission medications   Medication Sig Start Date End Date Taking? Authorizing Provider  acyclovir (ZOVIRAX) 400 MG tablet Take 400 mg by mouth 2 (two) times daily. 09/12/17   [provider]  benzonatate  (TESSALON ) 100 MG capsule Take 1 capsule (100 mg total) by mouth every 8 (eight) hours. 06/18/23   Pamella Ozell LABOR, DO  gabapentin (NEURONTIN) 100 MG capsule Take 1 capsule by mouth 2 (two) times daily. 10/11/17   [provider]  linaclotide  (LINZESS ) 72 MCG capsule Take 1 capsule (72 mcg total) by mouth daily before breakfast. 04/09/21   Esterwood, Amy S, PA-C  meclizine  (ANTIVERT ) 25 MG tablet Take 1 tablet (25 mg total) by mouth 2 (two) times daily as needed for up to 10 doses for dizziness. 04/28/22   Curatolo, Adam, DO  methylPREDNISolone  (MEDROL  DOSEPAK) 4 MG TBPK tablet Follow package insert Patient not taking: Reported on 09/20/2022 04/28/22   Ruthe Cornet, DO  naproxen (NAPROSYN) 500 MG tablet naproxen 500 mg tablet    [provider]  senna-docusate (SENOKOT-S) 8.6-50 MG tablet Stool Softener-Laxative 8.6 mg-50 mg tablet  2 TABLETS AT BEDTIME ORALLY 30 DAY(S)    [provider]  timolol (TIMOPTIC) 0.25 % ophthalmic solution 1 drop 2 (two) times daily.    [provider]    Allergies: Patient has no known allergies.    Review of Systems  Constitutional:  Negative for chills and fever.  HENT:  Negative for ear pain and sore throat.   Eyes:  Negative for pain and visual disturbance.  Respiratory:  Negative for cough and shortness of breath.   Cardiovascular:   Negative for chest pain and palpitations.  Gastrointestinal:  Negative for abdominal pain and vomiting.  Genitourinary:  Negative for dysuria and hematuria.  Musculoskeletal:  Negative for arthralgias and back pain.  Skin:  Negative for color change and rash.  Neurological:  Negative for seizures and syncope.  All other systems reviewed and are negative.   Updated Vital Signs BP (!) 143/77 (BP Location: Right Arm)   Pulse 69   Temp 97.9 F (36.6 C) (Oral)   Resp 18   Ht 5' 7 (1.702 m)   Wt 86.2 kg   SpO2 95%   BMI 29.76 kg/m   Physical Exam Vitals and nursing note reviewed.  Constitutional:      General: She is not in acute distress.    Appearance: She is well-developed.  HENT:     Head: Normocephalic and atraumatic.  Eyes:     Conjunctiva/sclera: Conjunctivae normal.  Cardiovascular:     Rate and Rhythm: Normal rate and regular rhythm.     Heart sounds: No murmur heard. Pulmonary:     Effort: Pulmonary effort is normal. No respiratory distress.     Breath sounds: Normal breath sounds.  Abdominal:     Palpations: Abdomen is soft.     Tenderness: There is no abdominal tenderness.  Musculoskeletal:        General: No swelling.     Cervical back: Neck supple.  Feet:  Skin:    General: Skin is warm and dry.     Capillary Refill: Capillary refill takes less than 2 seconds.  Neurological:     Mental Status: She is alert.  Psychiatric:        Mood and Affect: Mood normal.     (all labs ordered are listed, but only abnormal results are displayed) Labs Reviewed - No data to display  EKG: None  Radiology: DG Toe 5th Left Result Date: 10/09/2023 CLINICAL DATA:  Toe injury EXAM: DG TOE 5TH LEFT COMPARISON:  None Available. FINDINGS: Possible tiny intra-articular fracture at the base of the fifth distal phalanx on the oblique view. No malalignment. Positive for soft tissue swelling IMPRESSION: Possible tiny intra-articular fracture at the base of the fifth  distal phalanx. Electronically Signed   By: Luke Bun M.D.   On: 10/09/2023 22:34     Procedures   Medications Ordered in the ED - No data to display                                  Medical Decision Making Amount and/or Complexity of Data Reviewed Radiology: ordered.   Patient states that she had hit her fifth toe on her left foot this morning.  This has been having pain.  Pain with walking.  No open wound.   Upon exam, patient no acute distress.  Mild bruising to the left fifth digit of the left foot.  Possibly very minimal intra-articular fracture of the fifth digit.  Recommended buddy tape.  Hard soled shoe.  Can follow-up with podiatry.  Can take Tylenol for pain.       Final diagnoses:  Closed nondisplaced fracture of phalanx of lesser toe of left foot, unspecified phalanx, initial encounter    ED Discharge Orders     None          Simon Lavonia SAILOR, MD 10/09/23 2245

## 2023-10-09 NOTE — Discharge Instructions (Addendum)
 And there appears to be a very minimal fracture of your left 5th toe.  You can wear these hard shoes for support.  You can also take them together if you feel like this helps.  You can follow-up with podiatry.  There will be nothing to do for this fracture.  For pain, you can take 1000 mg of Tylenol or 1 g of Tylenol every 6-8 hours.  Do not exceed more than 4000 mg or 4 g in a 24-hour period.

## 2023-10-14 DIAGNOSIS — S92535A Nondisplaced fracture of distal phalanx of left lesser toe(s), initial encounter for closed fracture: Secondary | ICD-10-CM | POA: Diagnosis not present

## 2023-11-07 DIAGNOSIS — E785 Hyperlipidemia, unspecified: Secondary | ICD-10-CM | POA: Diagnosis not present

## 2023-11-07 DIAGNOSIS — Z713 Dietary counseling and surveillance: Secondary | ICD-10-CM | POA: Diagnosis not present

## 2023-11-15 DIAGNOSIS — M85851 Other specified disorders of bone density and structure, right thigh: Secondary | ICD-10-CM | POA: Diagnosis not present

## 2023-11-15 DIAGNOSIS — C50811 Malignant neoplasm of overlapping sites of right female breast: Secondary | ICD-10-CM | POA: Diagnosis not present

## 2023-11-15 DIAGNOSIS — Z170 Estrogen receptor positive status [ER+]: Secondary | ICD-10-CM | POA: Diagnosis not present

## 2023-11-15 DIAGNOSIS — M85852 Other specified disorders of bone density and structure, left thigh: Secondary | ICD-10-CM | POA: Diagnosis not present

## 2023-11-15 DIAGNOSIS — Z79811 Long term (current) use of aromatase inhibitors: Secondary | ICD-10-CM | POA: Diagnosis not present

## 2023-11-15 DIAGNOSIS — D649 Anemia, unspecified: Secondary | ICD-10-CM | POA: Diagnosis not present

## 2023-11-15 DIAGNOSIS — C50911 Malignant neoplasm of unspecified site of right female breast: Secondary | ICD-10-CM | POA: Diagnosis not present

## 2023-12-01 ENCOUNTER — Telehealth: Payer: Self-pay | Admitting: Internal Medicine

## 2023-12-01 MED ORDER — LINACLOTIDE 72 MCG PO CAPS: 72.0000 ug | ORAL_CAPSULE | Freq: Every day | ORAL | 1 refills | Status: DC

## 2023-12-01 NOTE — Telephone Encounter (Signed)
 Informed patient that she has not had this prescription renewed in over a year. Patient states she only takes Linzess  as needed and her prescription has expired. Informed patient I will send enough refills to make it to her appt. Patient verbalized understanding.

## 2023-12-01 NOTE — Telephone Encounter (Signed)
 Patient requesting prescription refill for linzess .   [facility] [address].  Patient scheduled for next available in November.

## 2023-12-01 NOTE — Addendum Note (Signed)
 Addended by: MADAN, Quinn Bartling L on: 12/01/2023 02:35 PM   Modules accepted: Orders

## 2023-12-12 DIAGNOSIS — Z713 Dietary counseling and surveillance: Secondary | ICD-10-CM | POA: Diagnosis not present

## 2023-12-12 DIAGNOSIS — I10 Essential (primary) hypertension: Secondary | ICD-10-CM | POA: Diagnosis not present

## 2023-12-12 DIAGNOSIS — E785 Hyperlipidemia, unspecified: Secondary | ICD-10-CM | POA: Diagnosis not present

## 2024-01-09 DIAGNOSIS — E785 Hyperlipidemia, unspecified: Secondary | ICD-10-CM | POA: Diagnosis not present

## 2024-01-09 DIAGNOSIS — I10 Essential (primary) hypertension: Secondary | ICD-10-CM | POA: Diagnosis not present

## 2024-01-09 DIAGNOSIS — Z713 Dietary counseling and surveillance: Secondary | ICD-10-CM | POA: Diagnosis not present

## 2024-01-12 NOTE — Progress Notes (Signed)
 Ellouise Console, PA-C [address] [place], [place]  [address] Phone: [phone]   Primary Care Physician: Dyane Anthony RAMAN, FNP  Primary Gastroenterologist:  Ellouise Console, PA-C / Dr. Gordy Starch   Chief Complaint: Follow-up chronic constipation and bloating       HPI:   Discussed the use of AI scribe software for clinical note transcription with the patient, who gave verbal consent to proceed.  63 year old female, with history of bipolar disorder, returns for follow-up of IBS, chronic constipation, and chronic bloating.  Last seen in our office 03/2021.  History of irritable bowel syndrome.  Treated with Linzess  72 mcg daily (since 2019) and Benefiber 1 tablespoon daily.  Patient is requesting refill of Linzess  72 mcg.  03/2018 virtual colonoscopy was negative. 5-year repeat screening colonoscopy was due 03/2023.  She has family history of colon cancer in maternal grandmother age 28 and maternal grandfather age 8.  11/15/2023 labs: Normal CBC, Hgb 12.3. History of Present Illness Her gastrointestinal symptoms have remained stable over the past three to four years. She takes Linzess  72mcg as needed for gastrointestinal issues, avoiding overuse due to the risk of diarrhea. She experiences bloating but no significant changes in her bowel habits, having bowel movements almost daily.  She denies blood in stool.  She has no GI symptoms or concerns today.  She is interested in scheduling repeat virtual colonoscopy for colon cancer screening.  She has a history of breast cancer, for which she underwent a lumpectomy in March 2015. The initial mammogram and biopsy confirmed cancer, but the lymph nodes were clear. She feels healthy post-surgery.  PMH: Hypertension, breast cancer, bipolar disorder, hyperlipidemia, glaucoma, herpes zoster.  Current Outpatient Medications  Medication Sig Dispense Refill   acyclovir (ZOVIRAX) 400 MG tablet Take 400 mg by mouth 2 (two) times daily.  12    benzonatate  (TESSALON ) 100 MG capsule Take 1 capsule (100 mg total) by mouth every 8 (eight) hours. 21 capsule 0   gabapentin (NEURONTIN) 100 MG capsule Take 1 capsule by mouth 2 (two) times daily.  11   meclizine  (ANTIVERT ) 25 MG tablet Take 1 tablet (25 mg total) by mouth 2 (two) times daily as needed for up to 10 doses for dizziness. 10 tablet 0   naproxen (NAPROSYN) 500 MG tablet naproxen 500 mg tablet     senna-docusate (SENOKOT-S) 8.6-50 MG tablet Stool Softener-Laxative 8.6 mg-50 mg tablet  2 TABLETS AT BEDTIME ORALLY 30 DAY(S)     timolol (TIMOPTIC) 0.25 % ophthalmic solution 1 drop 2 (two) times daily.     linaclotide  (LINZESS ) 72 MCG capsule Take 1 capsule (72 mcg total) by mouth daily before breakfast. 90 capsule 3   methylPREDNISolone  (MEDROL  DOSEPAK) 4 MG TBPK tablet Follow package insert (Patient not taking: Reported on 01/13/2024) 21 each 0   No current facility-administered medications for this visit.    Allergies as of 01/13/2024   (No Known Allergies)    Past Medical History:  Diagnosis Date   Anxiety    Bipolar disorder (HCC)    Liver enlargement 04/2017    Past Surgical History:  Procedure Laterality Date   BREAST BIOPSY     TONSILLECTOMY AND ADENOIDECTOMY      Review of Systems:    All systems reviewed and negative except where noted in HPI.    Physical Exam:  BP 106/70   Pulse (!) 58   Ht 5' 7 (1.702 m)   Wt 190 lb 8 oz (86.4 kg)  BMI 29.84 kg/m  No LMP recorded. Patient is postmenopausal.  General: Well-nourished, well-developed in no acute distress.  Lungs: Clear to auscultation bilaterally. Non-labored. Heart: Regular rate and rhythm, no murmurs rubs or gallops.  Abdomen: Bowel sounds are normal; Abdomen is Soft; No hepatosplenomegaly, masses or hernias;  No Abdominal Tenderness; No guarding or rebound tenderness. Neuro: Alert and oriented x 3.  Grossly intact.  Psych: Alert and cooperative, anxious, talkative, manic mood and  affect.   Imaging Studies: No results found.  Labs: CBC    Component Value Date/Time   WBC 6.5 04/03/2018 1848   RBC 4.37 04/03/2018 1848   HGB 12.6 04/03/2018 1848   HCT 38.9 04/03/2018 1848   PLT 223 04/03/2018 1848   MCV 89.0 04/03/2018 1848   MCH 28.8 04/03/2018 1848   MCHC 32.4 04/03/2018 1848   RDW 13.2 04/03/2018 1848   LYMPHSABS 2.4 04/03/2018 1848   MONOABS 0.5 04/03/2018 1848   EOSABS 0.1 04/03/2018 1848   BASOSABS 0.1 04/03/2018 1848    CMP     Component Value Date/Time   NA 139 04/03/2018 1848   K 3.7 04/03/2018 1848   CL 108 04/03/2018 1848   CO2 24 04/03/2018 1848   GLUCOSE 92 04/03/2018 1848   BUN 13 04/03/2018 1848   CREATININE 0.71 04/03/2018 1848   CALCIUM 9.4 04/03/2018 1848   PROT 7.5 04/03/2018 1848   ALBUMIN 4.3 04/03/2018 1848   AST 21 04/03/2018 1848   ALT 28 04/03/2018 1848   ALKPHOS 94 04/03/2018 1848   BILITOT 0.3 04/03/2018 1848   GFRNONAA >60 04/03/2018 1848   GFRAA >60 04/03/2018 1848       Assessment and Plan:   Misty Campos is a 63 y.o. y/o female   1.  Irritable bowel syndrome, constipation predominant: Controlled on Linzess  72 - Refill Linzess  72 micrograms once daily, #90, 3 refills. - Continue high-fiber diet and 64 ounces of water daily.  2.  Family history of colon cancer maternal grandmother age 62, maternal grandfather age 62.  3.  Colon cancer screening - I discussed colon cancer screening options with patient at length.  Risks and benefits of Cologuard versus traditional colonoscopy were discussed.  Patient would like to proceed with screening Cologuard test.  Patient declined traditional colonoscopy.  Patient is made aware they need repeat screening Cologuard every 3 years.  If Cologuard is positive, then we will need to schedule a diagnostic colonoscopy.  Patient expressed understanding. -Cologuard test was ordered.  Repeat every 3 years. - I did not order virtual colonoscopy for screening, as it is  likely not approved by insurance.    Ellouise Console, PA-C  Follow up in 1 year to refill Linzess .  Also follow-up based on Cologuard results.

## 2024-01-13 ENCOUNTER — Ambulatory Visit: Admitting: Physician Assistant

## 2024-01-13 ENCOUNTER — Encounter: Payer: Self-pay | Admitting: Physician Assistant

## 2024-01-13 VITALS — BP 106/70 | HR 58 | Ht 67.0 in | Wt 190.5 lb

## 2024-01-13 DIAGNOSIS — K581 Irritable bowel syndrome with constipation: Secondary | ICD-10-CM

## 2024-01-13 DIAGNOSIS — Z1211 Encounter for screening for malignant neoplasm of colon: Secondary | ICD-10-CM | POA: Diagnosis not present

## 2024-01-13 MED ORDER — LINACLOTIDE 72 MCG PO CAPS: 72.0000 ug | ORAL_CAPSULE | Freq: Every day | ORAL | 3 refills | Status: AC

## 2024-01-13 NOTE — Patient Instructions (Addendum)
 We have sent the following medications to your pharmacy for you to pick up at your convenience: Linzess  72 mcg once daily   Your provider has ordered Cologuard testing as an option for colon cancer screening. This is performed by [facility] [employer] and may be out of network with your insurance. PRIOR to completing the test, it is YOUR responsibility to contact your insurance about covered benefits for this test. Your out of pocket expense could be anywhere from $0.00 to $649.00.   When you call to check coverage with your insurer, please provide the following information:   -The ONLY provider of Cologuard is [facility]  - CPT code for Cologuard is [phone].  [facility] NPI # [id number]  -[employer] Tax ID # [id number]   We have already sent your demographic and insurance information to [facility] (phone number [phone]) and they should contact you within the next week regarding your test. If you have not heard from them within the next week, please call our office at [phone].  Please follow up sooner if symptoms increase or worsen  Due to recent changes in healthcare laws, you may see the results of your imaging and laboratory studies on MyChart before your provider has had a chance to review them.  We understand that in some cases there may be results that are confusing or concerning to you. Not all laboratory results come back in the same time frame and the provider may be waiting for multiple results in order to interpret others.  Please give us  48 hours in order for your provider to thoroughly review all the results before contacting the office for clarification of your results.   Thank you for trusting me with your gastrointestinal care!   Ellouise Console, PA-C _______________________________________________________  If your blood pressure at your visit was 140/90 or greater, please contact your primary care physician to follow up on  this.  _______________________________________________________  If you are age 65 or older, your body mass index should be between 23-30. Your Body mass index is 29.84 kg/m. If this is out of the aforementioned range listed, please consider follow up with your Primary Care Provider.  If you are age 50 or younger, your body mass index should be between 19-25. Your Body mass index is 29.84 kg/m. If this is out of the aformentioned range listed, please consider follow up with your Primary Care Provider.   ________________________________________________________  The [facility] providers would like to encourage you to use MYCHART to communicate with providers for non-urgent requests or questions.  Due to long hold times on the telephone, sending your provider a message by [facility] may be a faster and more efficient way to get a response.  Please allow 48 business hours for a response.  Please remember that this is for non-urgent requests.  _______________________________________________________

## 2024-02-03 DIAGNOSIS — R0981 Nasal congestion: Secondary | ICD-10-CM | POA: Diagnosis not present

## 2024-02-03 DIAGNOSIS — R051 Acute cough: Secondary | ICD-10-CM | POA: Diagnosis not present

## 2024-02-03 DIAGNOSIS — J029 Acute pharyngitis, unspecified: Secondary | ICD-10-CM | POA: Diagnosis not present
